# Patient Record
Sex: Female | Born: 1965 | Race: White | Hispanic: No | Smoking: Former smoker
Health system: Southern US, Community
[De-identification: ages and names within clinical notes are randomized; demographics above are authoritative.]

## PROBLEM LIST (undated history)

## (undated) DIAGNOSIS — Z9889 Other specified postprocedural states: Secondary | ICD-10-CM

## (undated) DIAGNOSIS — F419 Anxiety disorder, unspecified: Secondary | ICD-10-CM

## (undated) DIAGNOSIS — F410 Panic disorder [episodic paroxysmal anxiety] without agoraphobia: Secondary | ICD-10-CM

## (undated) DIAGNOSIS — I2510 Atherosclerotic heart disease of native coronary artery without angina pectoris: Secondary | ICD-10-CM

## (undated) DIAGNOSIS — E039 Hypothyroidism, unspecified: Secondary | ICD-10-CM

## (undated) DIAGNOSIS — R079 Chest pain, unspecified: Secondary | ICD-10-CM

## (undated) DIAGNOSIS — E785 Hyperlipidemia, unspecified: Secondary | ICD-10-CM

## (undated) DIAGNOSIS — E079 Disorder of thyroid, unspecified: Secondary | ICD-10-CM

## (undated) DIAGNOSIS — R112 Nausea with vomiting, unspecified: Secondary | ICD-10-CM

## (undated) DIAGNOSIS — M1990 Unspecified osteoarthritis, unspecified site: Secondary | ICD-10-CM

## (undated) DIAGNOSIS — K219 Gastro-esophageal reflux disease without esophagitis: Secondary | ICD-10-CM

## (undated) HISTORY — DX: Atherosclerotic heart disease of native coronary artery without angina pectoris: I25.10

## (undated) HISTORY — PX: TUBAL LIGATION: SHX77

## (undated) HISTORY — DX: Anxiety disorder, unspecified: F41.9

## (undated) HISTORY — DX: Chest pain, unspecified: R07.9

## (undated) HISTORY — DX: Panic disorder (episodic paroxysmal anxiety): F41.0

## (undated) HISTORY — PX: BREAST SURGERY: SHX581

## (undated) HISTORY — PX: ENDOMETRIAL ABLATION: SHX621

---

## 2000-10-31 ENCOUNTER — Ambulatory Visit (HOSPITAL_COMMUNITY): Admission: RE | Admit: 2000-10-31 | Discharge: 2000-10-31 | Payer: Self-pay | Admitting: Neurosurgery

## 2000-10-31 ENCOUNTER — Encounter: Payer: Self-pay | Admitting: General Surgery

## 2001-02-23 ENCOUNTER — Other Ambulatory Visit: Admission: RE | Admit: 2001-02-23 | Discharge: 2001-02-23 | Payer: Self-pay | Admitting: Obstetrics and Gynecology

## 2005-07-15 ENCOUNTER — Ambulatory Visit (HOSPITAL_COMMUNITY): Admission: RE | Admit: 2005-07-15 | Discharge: 2005-07-15 | Payer: Self-pay | Admitting: Family Medicine

## 2005-10-13 ENCOUNTER — Emergency Department (HOSPITAL_COMMUNITY): Admission: EM | Admit: 2005-10-13 | Discharge: 2005-10-13 | Payer: Self-pay | Admitting: Emergency Medicine

## 2006-06-27 ENCOUNTER — Ambulatory Visit (HOSPITAL_COMMUNITY): Admission: RE | Admit: 2006-06-27 | Discharge: 2006-06-27 | Payer: Self-pay | Admitting: Family Medicine

## 2008-01-14 ENCOUNTER — Emergency Department (HOSPITAL_COMMUNITY): Admission: EM | Admit: 2008-01-14 | Discharge: 2008-01-14 | Payer: Self-pay | Admitting: Emergency Medicine

## 2008-04-04 ENCOUNTER — Ambulatory Visit (HOSPITAL_COMMUNITY): Admission: RE | Admit: 2008-04-04 | Discharge: 2008-04-04 | Payer: Self-pay | Admitting: Family Medicine

## 2008-06-18 ENCOUNTER — Emergency Department (HOSPITAL_COMMUNITY): Admission: EM | Admit: 2008-06-18 | Discharge: 2008-06-18 | Payer: Self-pay | Admitting: Emergency Medicine

## 2008-06-18 ENCOUNTER — Encounter: Payer: Self-pay | Admitting: Orthopedic Surgery

## 2008-06-23 ENCOUNTER — Ambulatory Visit: Payer: Self-pay | Admitting: Orthopedic Surgery

## 2008-06-23 DIAGNOSIS — M79609 Pain in unspecified limb: Secondary | ICD-10-CM

## 2009-11-07 ENCOUNTER — Ambulatory Visit (HOSPITAL_COMMUNITY): Admission: RE | Admit: 2009-11-07 | Discharge: 2009-11-07 | Payer: Self-pay | Admitting: Internal Medicine

## 2009-11-13 ENCOUNTER — Encounter (HOSPITAL_COMMUNITY): Admission: RE | Admit: 2009-11-13 | Discharge: 2009-11-13 | Payer: Self-pay | Admitting: Internal Medicine

## 2009-11-13 ENCOUNTER — Emergency Department (HOSPITAL_COMMUNITY): Admission: EM | Admit: 2009-11-13 | Discharge: 2009-11-13 | Payer: Self-pay | Admitting: Emergency Medicine

## 2009-11-23 ENCOUNTER — Encounter (INDEPENDENT_AMBULATORY_CARE_PROVIDER_SITE_OTHER): Payer: Self-pay | Admitting: *Deleted

## 2009-12-07 ENCOUNTER — Emergency Department (HOSPITAL_COMMUNITY): Admission: EM | Admit: 2009-12-07 | Discharge: 2009-12-07 | Payer: Self-pay | Admitting: Emergency Medicine

## 2009-12-15 ENCOUNTER — Emergency Department (HOSPITAL_COMMUNITY): Admission: EM | Admit: 2009-12-15 | Discharge: 2009-12-15 | Payer: Self-pay | Admitting: Emergency Medicine

## 2009-12-28 ENCOUNTER — Ambulatory Visit (HOSPITAL_COMMUNITY)
Admission: RE | Admit: 2009-12-28 | Discharge: 2009-12-28 | Payer: Self-pay | Source: Home / Self Care | Admitting: Family Medicine

## 2010-02-01 ENCOUNTER — Ambulatory Visit: Payer: Self-pay | Admitting: Gastroenterology

## 2010-02-01 DIAGNOSIS — K219 Gastro-esophageal reflux disease without esophagitis: Secondary | ICD-10-CM

## 2010-02-01 DIAGNOSIS — K3189 Other diseases of stomach and duodenum: Secondary | ICD-10-CM

## 2010-02-01 DIAGNOSIS — R1013 Epigastric pain: Secondary | ICD-10-CM

## 2010-02-19 ENCOUNTER — Ambulatory Visit (HOSPITAL_COMMUNITY)
Admission: RE | Admit: 2010-02-19 | Discharge: 2010-02-19 | Payer: Self-pay | Source: Home / Self Care | Admitting: Gastroenterology

## 2010-02-19 ENCOUNTER — Ambulatory Visit: Payer: Self-pay | Admitting: Gastroenterology

## 2010-03-20 ENCOUNTER — Telehealth (INDEPENDENT_AMBULATORY_CARE_PROVIDER_SITE_OTHER): Payer: Self-pay

## 2010-03-23 ENCOUNTER — Ambulatory Visit (HOSPITAL_COMMUNITY)
Admission: RE | Admit: 2010-03-23 | Discharge: 2010-03-23 | Payer: Self-pay | Source: Home / Self Care | Attending: Obstetrics and Gynecology | Admitting: Obstetrics and Gynecology

## 2010-04-03 ENCOUNTER — Telehealth (INDEPENDENT_AMBULATORY_CARE_PROVIDER_SITE_OTHER): Payer: Self-pay

## 2010-04-29 ENCOUNTER — Encounter: Payer: Self-pay | Admitting: Family Medicine

## 2010-05-08 NOTE — Assessment & Plan Note (Signed)
Summary: DYSPEPSIA, GERD   Visit Type:  Initial Consult Referring Provider:  Phillips Odor Primary Care Provider:  Phillips Odor, M.D.  Chief Complaint:  GERD.  History of Present Illness: Sx since May 2011, mild and then got worse in AUG. Burning constantly in chest.  Seen in the ED AUG 2011 [facility] and placed on OMP two times a day. Doing better and then SEP 2011 started having burning again changed to Nexium once daily on OCT 15. Not eating right and got burning and chest pain. Eating better now: chicken , bread, boiled potatoes. Lost 19 lbs. 15 mins after she eats mild burning pain, relieved w/ TUMs since Nexium. No problems swallowing, vomiting, nausea, diarrhea, constipation, BRBPR, or melena. Uses Tylenol. No ASA or NSAIDS. No Etoh/tob. Mouth is really dry. PND bad right now. Has had upset: soft stool. Stress: worse since May-money, family, job. Worries she may have cancer.   On Zoloft for 12 years and it makes her sleepy. NECK PAIN AFTER SLEEPING ON 2 PILLOWS.  Current Medications (verified): 1)  Zoloft 50 Mg Tabs (Sertraline Hcl) .... Take 1 Tablet By Mouth Once A Day 2)  Nexium 40 Mg Cpdr (Esomeprazole Magnesium) .... Take 1 Tablet By Mouth Once A Day  Allergies (verified): No Known Drug Allergies  Past History:  Past Medical History: Anxiety Panic attacks  Past Surgical History: c section x 2 Tubal Ligation  Family History: Family History of Diabetes Family History Coronary Heart Disease female < 64 Family History of Arthritis   GERD: Mother and dad: Prilosec/Prevacid No FH of Colon Cancer or polyps Family History of Liver Cancer: uncle, ETOH Family History of Pancreatic Cancer: no Family History of Stomach Cancer: no Family History of Breast Cancer: no Family History of Ovarian Cancer: no  Social History: Patient is separated for 4-5 years. 3 children: youngest 21 yo  child care Tobacco/Alcohol Use - no  Review of Systems       AUG 2011: 173 lbs, NL HIDA & ABD U/S  SEP  2011: CT A/P W/O IVC- FIBROIDS  OCT 2011-H. PYLORI SERO NEG  ROS PER HPI OTHERWISE ALL SYSTEMS NEGATIVE.  Vital Signs:  Patient profile:   45 year old female Height:      63 inches Weight:      161 pounds BMI:     28.62 Temp:     98.1 degrees F oral Pulse rate:   80 / minute BP sitting:   120 / 80  (left arm) Cuff size:   regular  Vitals Entered By: Cloria Spring LPN (February 01, 2010 10:08 AM)  Physical Exam  General:  Well developed, well nourished, no acute distress. Head:  Normocephalic and atraumatic. Eyes:  PERRL, no icterus. Mouth:  No deformity or lesions. Neck:  Supple; no masses. Lungs:  Clear throughout to auscultation. Heart:  Regular rate and rhythm; no murmurs. Abdomen:  Soft, nontender and nondistended. Normal bowel sounds. Extremities:  No edema or deformities noted. Neurologic:  Alert and  oriented x4;  grossly normal neurologically.  Impression & Recommendations:  Problem # 1:  DYSPEPSIA (ICD-536.8) Most likley 2o to non-ulcer dyspepsia, doubt H. pylori gastritis, PUD, or gastric CA. Declined increase in Zoloft and mental health referral. EGD NOV 2011. If Bx negative and no erosive esophagitis, consider change from Zoloft to Imipramine.  Problem # 2:  GERD (ICD-530.81) Assessment: Improved Continue Nexium once daily. Follow a low fat diet. Will provide reflux HO after EGD. OPV IN 4 MOS.  Appended Document: DYSPEPSIA, GERD 4  month reminder is in the computer  Appended Document: Orders Update    Clinical Lists Changes  Orders: Added new Service order of Consultation Level IV [phone]) - Signed

## 2010-05-08 NOTE — Letter (Signed)
Summary: Unable to Reach, Consult Scheduled  [facility]  [address]   [place], [place] [address]   Phone: [phone]  Fax: [phone]    11/23/2009  Ann York [address] [place], [place]  [address] [birth year]   Dear [name],   We have been unable to reach you by phone.  Please contact our office with an updated phone number.  At the recommendation of DR [facility]  we have been asked to schedule you a consult with DR Jena Gauss OR DR FIELDS for ABDOMINAL PAIN .     Please call our office at [phone].     Thank you,    Diana Eves  [facility] R. Roetta Sessions, M.D.    Jonette Eva, M.D. Lorenza Burton, FNP-BC    Tana Coast, PA-C Phone: [phone]    Fax: [phone]

## 2010-05-10 NOTE — Progress Notes (Signed)
Summary: pt requests samples of Nexium  Phone Note Call from Patient   Caller: Patient Summary of Call: Pt requests samples of Nexium. Left 4 boxes (# 20 ) at the front for pick-up.  Initial call taken by: Cloria Spring LPN,  April 03, 2010 9:12 AM

## 2010-05-10 NOTE — Progress Notes (Signed)
Summary: pt requests samples of Nexium  Phone Note Call from Patient   Caller: Patient Summary of Call: Pt called and requested samples of Nexium. Said she has signed up for help with the Nexium program. Placing 2 boxes at the front for pick up. (that is all we have at present) Initial call taken by: Cloria Spring LPN,  March 20, 2010 8:52 AM

## 2010-05-22 ENCOUNTER — Encounter (INDEPENDENT_AMBULATORY_CARE_PROVIDER_SITE_OTHER): Payer: Self-pay | Admitting: *Deleted

## 2010-05-30 NOTE — Letter (Signed)
Summary: Recall Office Visit  [facility]  [address]   [place], [place] [address]   Phone: [phone]  Fax: [phone]      May 22, 2010   Ann York [address] [place], [place]  [address] 1965/09/13   Dear [name],   According to our records, it is time for you to schedule a follow-up office visit with [place].   At your convenience, please call [phone] to schedule an office visit. If you have any questions, concerns, or feel that this letter is in error, we would appreciate your call.   Sincerely,    Diana Eves  [facility] Ph: [phone]   Fax: [phone]

## 2010-06-19 LAB — CBC
HCT: 36.9 % (ref 36.0–46.0)
Hemoglobin: 12.5 g/dL (ref 12.0–15.0)
MCV: 85.8 fL (ref 78.0–100.0)
Platelets: 253 10*3/uL (ref 150–400)
RDW: 13.7 % (ref 11.5–15.5)

## 2010-06-19 LAB — SURGICAL PCR SCREEN
MRSA, PCR: NEGATIVE
Staphylococcus aureus: NEGATIVE

## 2010-06-21 LAB — DIFFERENTIAL
Basophils Absolute: 0 10*3/uL (ref 0.0–0.1)
Basophils Relative: 1 % (ref 0–1)
Eosinophils Relative: 1 % (ref 0–5)
Lymphocytes Relative: 17 % (ref 12–46)
Lymphs Abs: 1.4 10*3/uL (ref 0.7–4.0)
Monocytes Absolute: 0.5 10*3/uL (ref 0.1–1.0)
Monocytes Relative: 7 % (ref 3–12)

## 2010-06-21 LAB — GC/CHLAMYDIA PROBE AMP, GENITAL
Chlamydia, DNA Probe: NEGATIVE
GC Probe Amp, Genital: NEGATIVE

## 2010-06-21 LAB — PREGNANCY, URINE
Preg Test, Ur: NEGATIVE
Preg Test, Ur: NEGATIVE

## 2010-06-21 LAB — URINE MICROSCOPIC-ADD ON

## 2010-06-21 LAB — CBC
HCT: 39.8 % (ref 36.0–46.0)
Hemoglobin: 13.4 g/dL (ref 12.0–15.0)
MCH: 30 pg (ref 26.0–34.0)
Platelets: 318 10*3/uL (ref 150–400)

## 2010-06-21 LAB — WET PREP, GENITAL: Trich, Wet Prep: NONE SEEN

## 2010-06-21 LAB — URINALYSIS, ROUTINE W REFLEX MICROSCOPIC
Bilirubin Urine: NEGATIVE
Glucose, UA: NEGATIVE mg/dL
Glucose, UA: NEGATIVE mg/dL
Ketones, ur: 40 mg/dL — AB
Leukocytes, UA: NEGATIVE
Protein, ur: NEGATIVE mg/dL
Protein, ur: NEGATIVE mg/dL
Urobilinogen, UA: 0.2 mg/dL (ref 0.0–1.0)

## 2010-06-21 LAB — URINE CULTURE
Colony Count: NO GROWTH
Culture: NO GROWTH

## 2010-06-21 LAB — BASIC METABOLIC PANEL
Calcium: 9.3 mg/dL (ref 8.4–10.5)
Creatinine, Ser: 0.64 mg/dL (ref 0.4–1.2)
GFR calc Af Amer: >60 mL/min (ref >60)
Sodium: 138 mEq/L (ref 135–145)

## 2010-06-22 LAB — COMPREHENSIVE METABOLIC PANEL
AST: 22 U/L (ref 0–37)
Albumin: 4.3 g/dL (ref 3.5–5.2)
Chloride: 107 mEq/L (ref 96–112)
Creatinine, Ser: 0.67 mg/dL (ref 0.4–1.2)
GFR calc Af Amer: >60 mL/min (ref >60)
Total Bilirubin: 0.6 mg/dL (ref 0.3–1.2)
Total Protein: 7.8 g/dL (ref 6.0–8.3)

## 2010-06-22 LAB — CBC
MCH: 30 pg (ref 26.0–34.0)
Platelets: 295 10*3/uL (ref 150–400)
RBC: 4.45 MIL/uL (ref 3.87–5.11)
WBC: 9.1 10*3/uL (ref 4.0–10.5)

## 2010-06-22 LAB — DIFFERENTIAL
Basophils Absolute: 0 10*3/uL (ref 0.0–0.1)
Eosinophils Relative: 1 % (ref 0–5)
Lymphocytes Relative: 19 % (ref 12–46)
Lymphs Abs: 1.7 10*3/uL (ref 0.7–4.0)
Monocytes Absolute: 0.5 10*3/uL (ref 0.1–1.0)
Monocytes Relative: 6 % (ref 3–12)

## 2010-12-04 IMAGING — CT CT ABD-PELV W/O CM
2 of 4 series · 17 of 46 positions shown, 19 images · non-contrast
Comparison: None.

CLINICAL DATA: Lower abdominal and back pain.

CT ABDOMEN AND PELVIS WITHOUT CONTRAST
TECHNIQUE: Multidetector CT imaging of the abdomen and pelvis was
performed following the standard protocol without intravenous
contrast.

[Series 2: abd|pel w/o 5.0 b40f · axial · non-contrast · 0.66mm/px · z∈[-438,-58]mm · 14 of 84 slices shown, 16 images]
[im 4/84  soft-tissue]
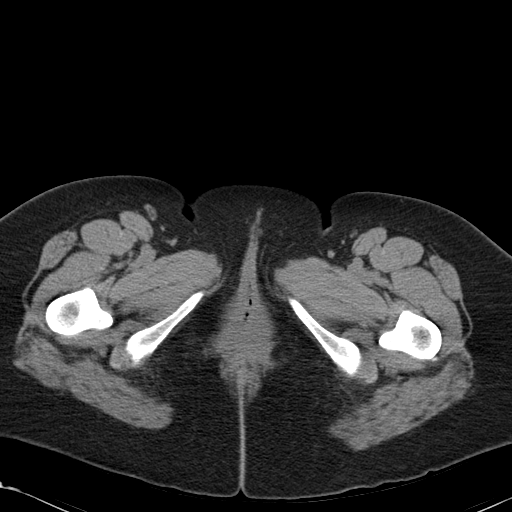
[im 4/84  bone]
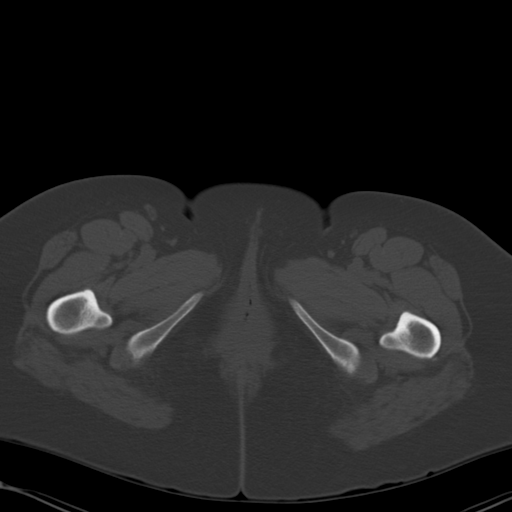
[im 10/84  soft-tissue]
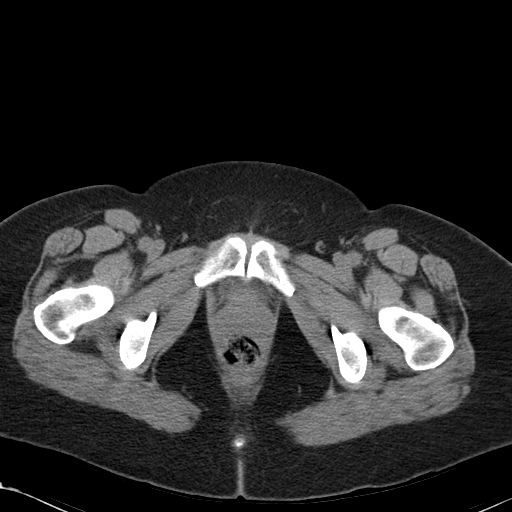
[im 17/84  soft-tissue]
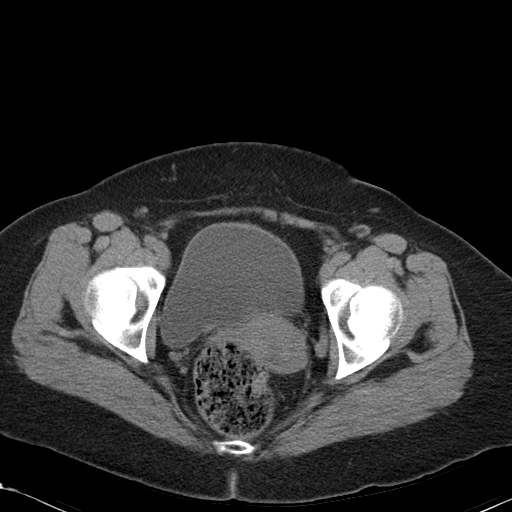
[im 24/84  soft-tissue]
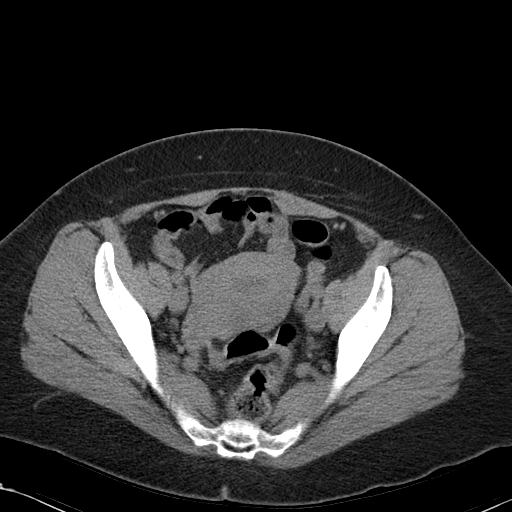
[im 27/84  soft-tissue]
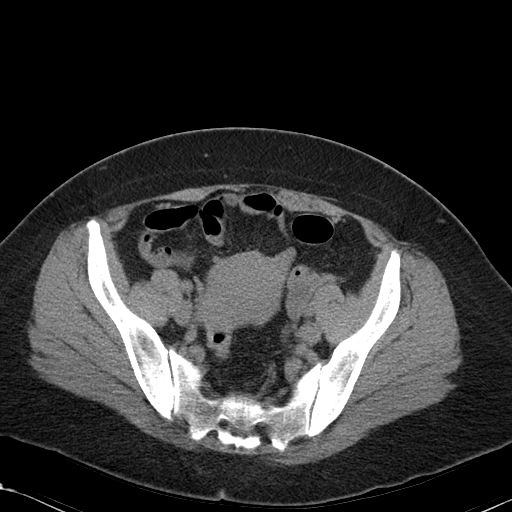
[im 34/84  soft-tissue]
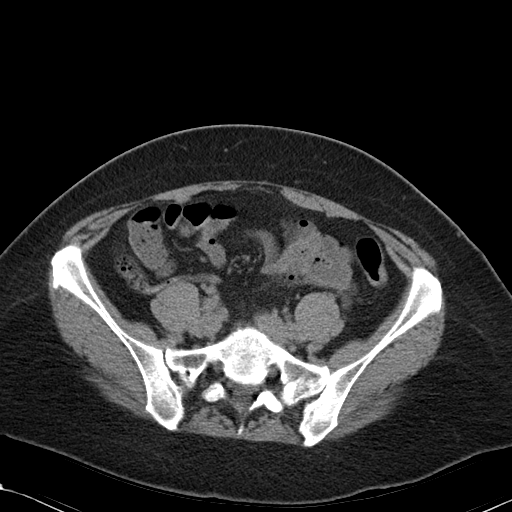
[im 40/84  soft-tissue]
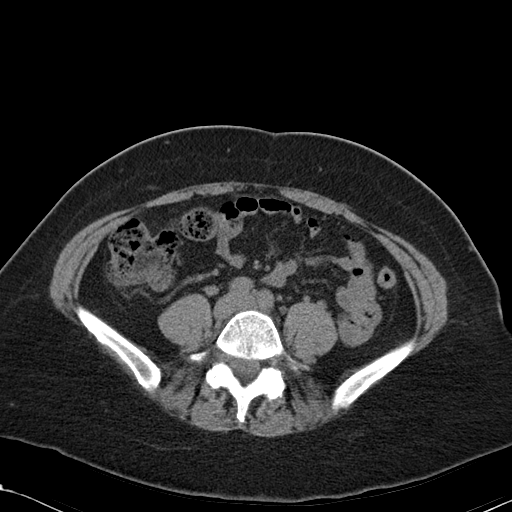
[im 44/84  soft-tissue]
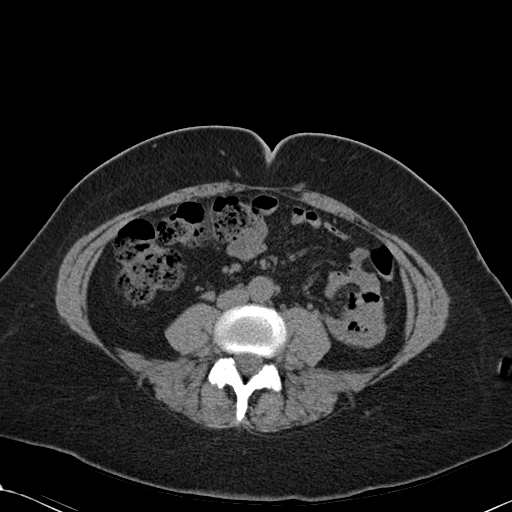
[im 50/84  soft-tissue]
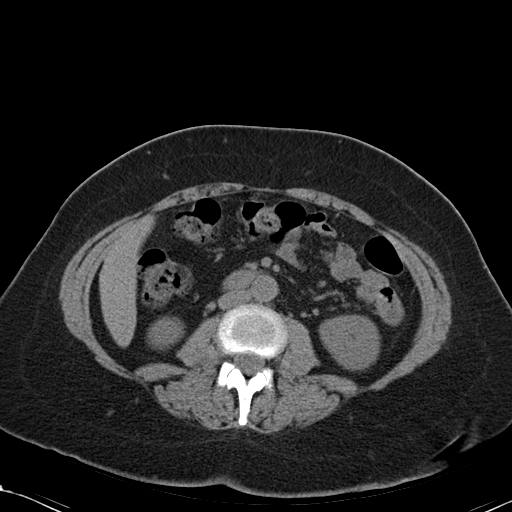
[im 50/84  bone]
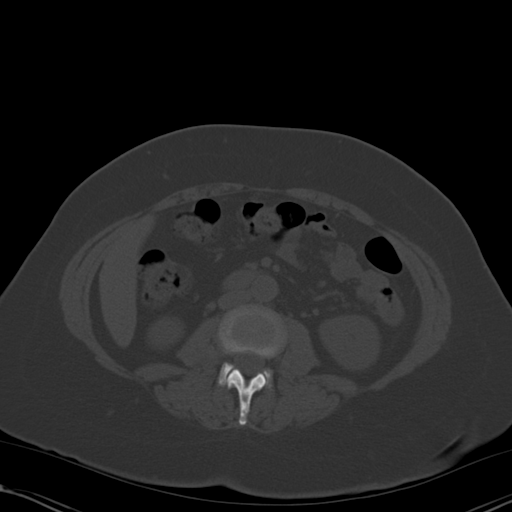
[im 57/84  soft-tissue]
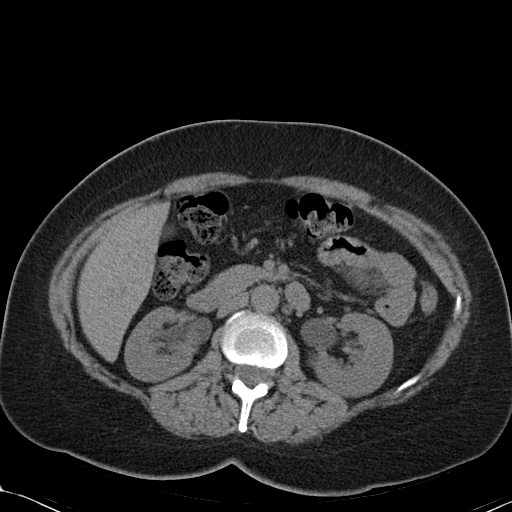
[im 64/84  soft-tissue]
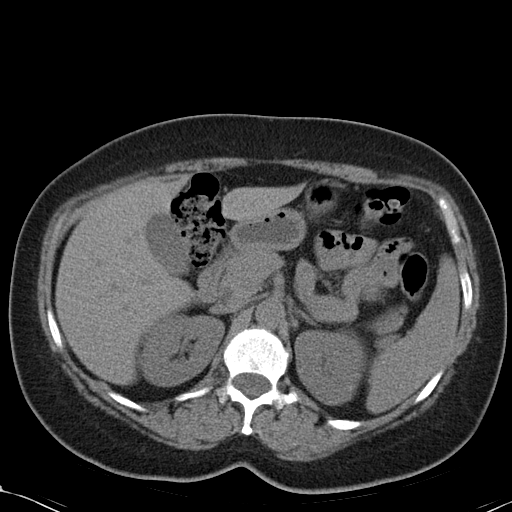
[im 67/84  soft-tissue]
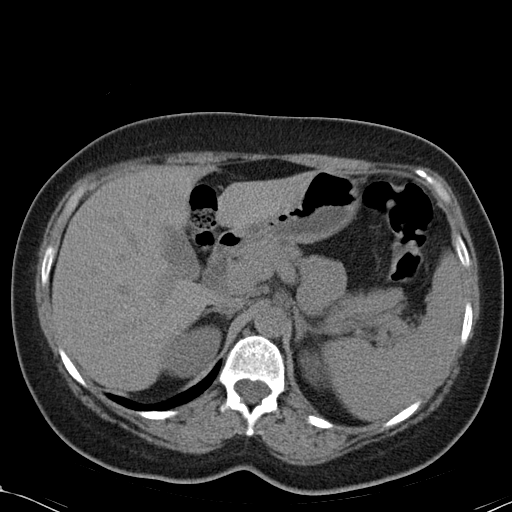
[im 74/84  soft-tissue]
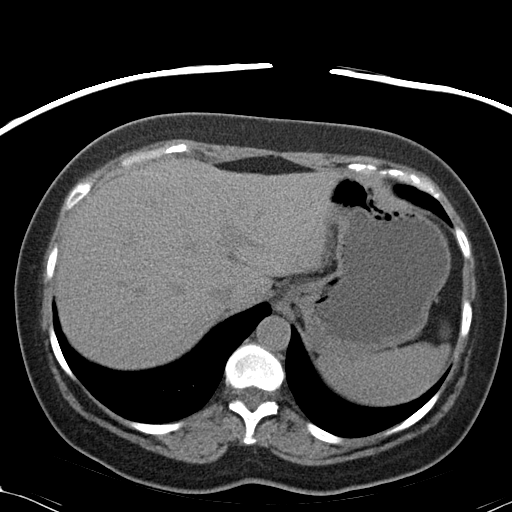
[im 80/84  soft-tissue]
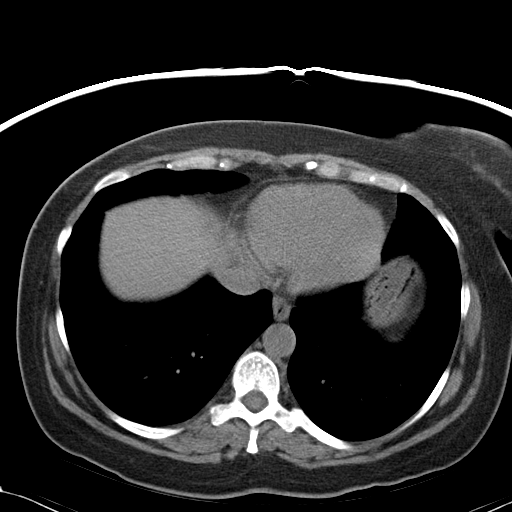

[Series 4: mpr cor (id) · coronal · 0.70mm/px · 3 of 66 slices shown]
[im 22/66  soft-tissue]
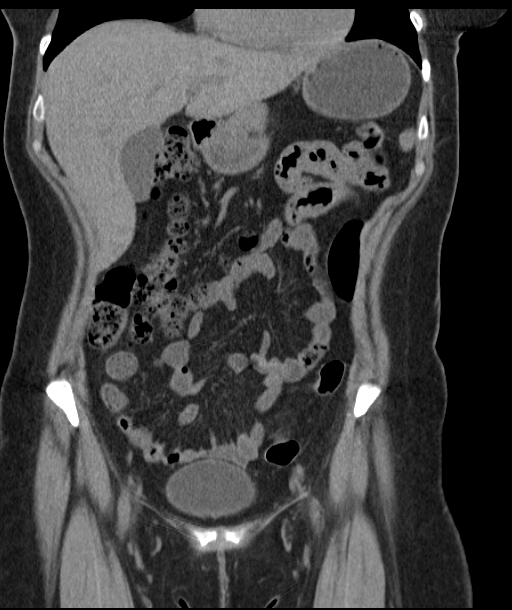
[im 29/66  soft-tissue]
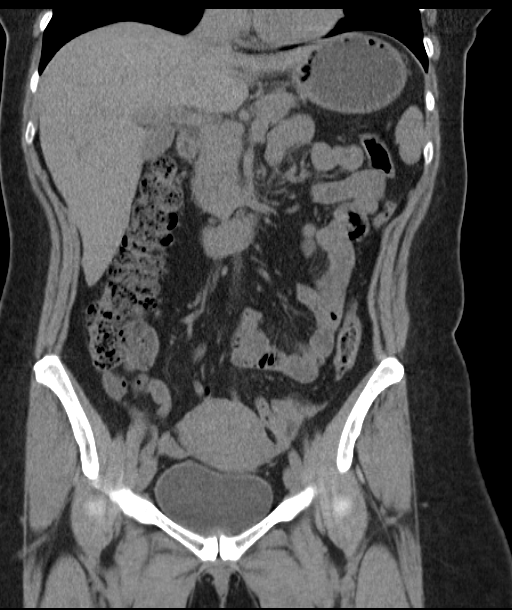
[im 37/66  soft-tissue]
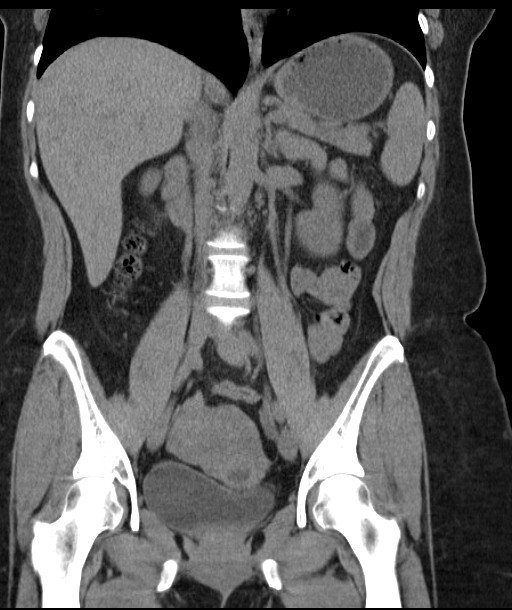

[17 of 46 positions shown; findings below may reference images not displayed]

FINDINGS: No evidence of renal calculi or hydronephrosis.  No
evidence of ureteral calculi or dilatation.

Mild lobulated contour the uterus is seen in small fibroids cannot
be excluded.  No other masses or lymphadenopathy identified within
the abdomen or pelvis.  No evidence of inflammatory process or
abnormal fluid collections.  Unopacified bowel has a normal
appearance.
IMPRESSION: 1.  No evidence of urolithiasis, hydronephrosis, or other acute
findings.
2.  Possible small uterine fibroids.  Non-emergent pelvic
ultrasound could be performed as an outpatient for further
evaluation if clinically warranted.

## 2010-12-05 ENCOUNTER — Ambulatory Visit: Payer: Self-pay | Admitting: Gastroenterology

## 2010-12-13 ENCOUNTER — Ambulatory Visit: Payer: Self-pay | Admitting: Gastroenterology

## 2010-12-13 ENCOUNTER — Telehealth: Payer: Self-pay | Admitting: Gastroenterology

## 2010-12-13 NOTE — Telephone Encounter (Signed)
noted 

## 2011-03-26 ENCOUNTER — Encounter (HOSPITAL_COMMUNITY): Payer: Self-pay | Admitting: *Deleted

## 2011-03-26 ENCOUNTER — Emergency Department (HOSPITAL_COMMUNITY): Payer: Medicaid Other

## 2011-03-26 ENCOUNTER — Emergency Department (HOSPITAL_COMMUNITY)
Admission: EM | Admit: 2011-03-26 | Discharge: 2011-03-26 | Disposition: A | Discharge status: Home / Self Care | Payer: Medicaid Other | Attending: Emergency Medicine | Admitting: Emergency Medicine

## 2011-03-26 DIAGNOSIS — J329 Chronic sinusitis, unspecified: Secondary | ICD-10-CM | POA: Insufficient documentation

## 2011-03-26 DIAGNOSIS — J40 Bronchitis, not specified as acute or chronic: Secondary | ICD-10-CM | POA: Insufficient documentation

## 2011-03-26 MED ORDER — AMOXICILLIN-POT CLAVULANATE 875-125 MG PO TABS: 1.0000 | ORAL_TABLET | Freq: Two times a day (BID) | ORAL | Status: AC

## 2011-03-26 MED ORDER — AMOXICILLIN-POT CLAVULANATE 875-125 MG PO TABS
1.0000 | ORAL_TABLET | Freq: Once | ORAL | Status: AC
  Administered 2011-03-26: 1 via ORAL
  Filled 2011-03-26: qty 1

## 2011-03-26 NOTE — ED Provider Notes (Signed)
History     CSN: [id number] Arrival date & time: 03/26/2011  7:22 PM   First MD Initiated Contact with Patient 03/26/11 1946      Chief Complaint  Patient presents with  . Cough    (Consider location/radiation/quality/duration/timing/severity/associated sxs/prior treatment) HPI Comments: Pt also having sinus pain and d/c   Patient is a 45 y.o. female presenting with cough. The history is provided by the patient. No language interpreter was used.  Cough This is a new problem. The current episode started 2 days ago. The problem occurs constantly. The problem has been gradually worsening. The cough is non-productive. The maximum temperature recorded prior to her arrival was 100 to 100.9 F. Pertinent negatives include no shortness of breath and no wheezing. She has tried nothing for the symptoms. She is not a smoker. Her past medical history is significant for bronchitis.    Past Medical History  Diagnosis Date  . Anxiety   . Panic attacks     Past Surgical History  Procedure Date  . Cesarean section     x 2  . Tubal ligation     History reviewed. No pertinent family history.  History  Substance Use Topics  . Smoking status: Former Smoker    Types: Cigarettes  . Smokeless tobacco: Not on file  . Alcohol Use: No    OB History    Grav Para Term Preterm Abortions TAB SAB Ect Mult Living                  Review of Systems  Constitutional: Positive for fever.  HENT: Positive for voice change and sinus pressure.   Respiratory: Positive for cough. Negative for shortness of breath, wheezing and stridor.     Allergies  Review of patient's allergies indicates no known allergies.  Home Medications   Current Outpatient Rx  Name Route Sig Dispense Refill  . ESOMEPRAZOLE MAGNESIUM 40 MG PO CPDR Oral Take 40 mg by mouth daily before breakfast.      . SERTRALINE HCL 50 MG PO TABS Oral Take 50 mg by mouth daily.        BP 118/81  Pulse 110  Temp(Src) 98.7 F (37.1  C) (Oral)  Resp 20  Ht 5\' 3"  (1.6 m)  Wt 136 lb (61.689 kg)  BMI 24.09 kg/m2  SpO2 100%  Physical Exam  Nursing note and vitals reviewed. Constitutional: She is oriented to person, place, and time. She appears well-developed and well-nourished. No distress.  HENT:  Head: Normocephalic and atraumatic.    Right Ear: External ear normal.  Left Ear: External ear normal.  Nose: Nose normal.  Mouth/Throat: Oropharynx is clear and moist. No oropharyngeal exudate.  Eyes: EOM are normal.  Neck: Normal range of motion.  Cardiovascular: Normal rate, regular rhythm and normal heart sounds.   Pulmonary/Chest: Effort normal and breath sounds normal.  Abdominal: Soft. She exhibits no distension. There is no tenderness.  Musculoskeletal: Normal range of motion.  Neurological: She is alert and oriented to person, place, and time.  Skin: Skin is warm and dry.  Psychiatric: She has a normal mood and affect. Judgment normal.    ED Course  Procedures (including critical care time)  Labs Reviewed - No data to display Dg Chest 2 View  03/26/2011  *RADIOLOGY REPORT*  Clinical Data:  Cough and congestion.  CHEST - 2 VIEW  Comparison: 01/14/2008  Findings: The heart size and mediastinal contours are within normal limits.  Both lungs are clear.  The  visualized skeletal structures are unremarkable.  IMPRESSION: No active disease.  Original Report Authenticated By: Reola Calkins, M.D.     No diagnosis found.    MDM          Worthy Rancher, PA 03/26/11 [phone]

## 2011-03-26 NOTE — ED Notes (Signed)
Pt c/o cough, congestion, low grade fever and headache for since Friday.

## 2011-03-27 NOTE — ED Provider Notes (Signed)
Medical screening examination/treatment/procedure(s) were performed by non-physician practitioner and as supervising physician I was immediately available for consultation/collaboration.  Nicoletta Dress. Colon Branch, MD 03/27/11 1312

## 2011-05-13 ENCOUNTER — Other Ambulatory Visit (HOSPITAL_COMMUNITY): Payer: Self-pay | Admitting: Family Medicine

## 2011-05-13 DIAGNOSIS — Z139 Encounter for screening, unspecified: Secondary | ICD-10-CM

## 2011-05-16 ENCOUNTER — Ambulatory Visit (HOSPITAL_COMMUNITY): Payer: Self-pay

## 2011-05-30 ENCOUNTER — Ambulatory Visit (HOSPITAL_COMMUNITY)
Admission: RE | Admit: 2011-05-30 | Discharge: 2011-05-30 | Disposition: A | Discharge status: Home / Self Care | Payer: Medicaid Other | Source: Ambulatory Visit | Attending: Family Medicine | Admitting: Family Medicine

## 2011-05-30 DIAGNOSIS — Z1231 Encounter for screening mammogram for malignant neoplasm of breast: Secondary | ICD-10-CM | POA: Insufficient documentation

## 2011-05-30 DIAGNOSIS — Z139 Encounter for screening, unspecified: Secondary | ICD-10-CM

## 2011-07-01 ENCOUNTER — Emergency Department (HOSPITAL_COMMUNITY)
Admission: EM | Admit: 2011-07-01 | Discharge: 2011-07-01 | Disposition: A | Discharge status: Home / Self Care | Payer: Medicaid Other | Attending: Emergency Medicine | Admitting: Emergency Medicine

## 2011-07-01 ENCOUNTER — Encounter (HOSPITAL_COMMUNITY): Payer: Self-pay | Admitting: *Deleted

## 2011-07-01 DIAGNOSIS — J0100 Acute maxillary sinusitis, unspecified: Secondary | ICD-10-CM | POA: Insufficient documentation

## 2011-07-01 DIAGNOSIS — H9202 Otalgia, left ear: Secondary | ICD-10-CM

## 2011-07-01 DIAGNOSIS — H9209 Otalgia, unspecified ear: Secondary | ICD-10-CM | POA: Insufficient documentation

## 2011-07-01 DIAGNOSIS — Z87891 Personal history of nicotine dependence: Secondary | ICD-10-CM | POA: Insufficient documentation

## 2011-07-01 MED ORDER — AMOXICILLIN 500 MG PO CAPS: 500.0000 mg | ORAL_CAPSULE | Freq: Three times a day (TID) | ORAL | Status: AC

## 2011-07-01 MED ORDER — AMOXICILLIN 250 MG PO CAPS
500.0000 mg | ORAL_CAPSULE | Freq: Once | ORAL | Status: AC
  Administered 2011-07-01: 500 mg via ORAL
  Filled 2011-07-01: qty 2

## 2011-07-01 MED ORDER — FLUTICASONE PROPIONATE 50 MCG/ACT NA SUSP: 2.0000 | Freq: Every day | NASAL | Status: AC

## 2011-07-01 NOTE — Discharge Instructions (Signed)
Otalgia The most common reason for this in children is an infection of the middle ear. Pain from the middle ear is usually caused by a build-up of fluid and pressure behind the eardrum. Pain from an earache can be sharp, dull, or burning. The pain may be temporary or constant. The middle ear is connected to the nasal passages by a short narrow tube called the Eustachian tube. The Eustachian tube allows fluid to drain out of the middle ear, and helps keep the pressure in your ear equalized. CAUSES  A cold or allergy can block the Eustachian tube with inflammation and the build-up of secretions. This is especially likely in small children, because their Eustachian tube is shorter and more horizontal. When the Eustachian tube closes, the normal flow of fluid from the middle ear is stopped. Fluid can accumulate and cause stuffiness, pain, hearing loss, and an ear infection if germs start growing in this area. SYMPTOMS  The symptoms of an ear infection may include fever, ear pain, fussiness, increased crying, and irritability. Many children will have temporary and minor hearing loss during and right after an ear infection. Permanent hearing loss is rare, but the risk increases the more infections a child has. Other causes of ear pain include retained water in the outer ear canal from swimming and bathing. Ear pain in adults is less likely to be from an ear infection. Ear pain may be referred from other locations. Referred pain may be from the joint between your jaw and the skull. It may also come from a tooth problem or problems in the neck. Other causes of ear pain include:  A foreign body in the ear.   Outer ear infection.   Sinus infections.   Impacted ear wax.   Ear injury.   Arthritis of the jaw or TMJ problems.   Middle ear infection.   Tooth infections.   Sore throat with pain to the ears.  DIAGNOSIS  Your caregiver can usually make the diagnosis by examining you. Sometimes other special  studies, including x-rays and lab work may be necessary. TREATMENT   If antibiotics were prescribed, use them as directed and finish them even if you or your child's symptoms seem to be improved.   Sometimes PE tubes are needed in children. These are little plastic tubes which are put into the eardrum during a simple surgical procedure. They allow fluid to drain easier and allow the pressure in the middle ear to equalize. This helps relieve the ear pain caused by pressure changes.  HOME CARE INSTRUCTIONS   Only take over-the-counter or prescription medicines for pain, discomfort, or fever as directed by your caregiver. DO NOT GIVE CHILDREN ASPIRIN because of the association of Reye's Syndrome in children taking aspirin.   Use a cold pack applied to the outer ear for 15 to 20 minutes, 3 to 4 times per day or as needed may reduce pain. Do not apply ice directly to the skin. You may cause frost bite.   Over-the-counter ear drops used as directed may be effective. Your caregiver may sometimes prescribe ear drops.   Resting in an upright position may help reduce pressure in the middle ear and relieve pain.   Ear pain caused by rapidly descending from high altitudes can be relieved by swallowing or chewing gum. Allowing infants to suck on a bottle during airplane travel can help.   Do not smoke in the house or near children. If you are unable to quit smoking, smoke outside.     Control allergies.  SEEK IMMEDIATE MEDICAL CARE IF:   You or your child are becoming sicker.   Pain or fever relief is not obtained with medicine.   You or your child's symptoms (pain, fever, or irritability) do not improve within 24 to 48 hours or as instructed.   Severe pain suddenly stops hurting. This may indicate a ruptured eardrum.   You or your children develop new problems such as severe headaches, stiff neck, difficulty swallowing, or swelling of the face or around the ear.  Document Released: 11/10/2003  Document Revised: 03/14/2011 Document Reviewed: 03/16/2008 [facility] Patient Information 2012 [place], [place].Sinusitis Sinuses are air pockets within the bones of your face. The growth of bacteria within a sinus leads to infection. The infection prevents the sinuses from draining. This infection is called sinusitis. SYMPTOMS  There will be different areas of pain depending on which sinuses have become infected.  The maxillary sinuses often produce pain beneath the eyes.   Frontal sinusitis may cause pain in the middle of the forehead and above the eyes.  Other problems (symptoms) include:  Toothaches.   Colored, pus-like (purulent) drainage from the nose.   Swelling, warmth, and tenderness over the sinus areas may be signs of infection.  TREATMENT  Sinusitis is most often determined by an exam.X-rays may be taken. If x-rays have been taken, make sure you obtain your results or find out how you are to obtain them. Your caregiver may give you medications (antibiotics). These are medications that will help kill the bacteria causing the infection. You may also be given a medication (decongestant) that helps to reduce sinus swelling.  HOME CARE INSTRUCTIONS   Only take over-the-counter or prescription medicines for pain, discomfort, or fever as directed by your caregiver.   Drink extra fluids. Fluids help thin the mucus so your sinuses can drain more easily.   Applying either moist heat or ice packs to the sinus areas may help relieve discomfort.   Use saline nasal sprays to help moisten your sinuses. The sprays can be found at your local drugstore.  SEEK IMMEDIATE MEDICAL CARE IF:  You have a fever.   You have increasing pain, severe headaches, or toothache.   You have nausea, vomiting, or drowsiness.   You develop unusual swelling around the face or trouble seeing.  MAKE SURE YOU:   Understand these instructions.   Will watch your condition.   Will get help right away if you  are not doing well or get worse.  Document Released: 03/25/2005 Document Revised: 03/14/2011 Document Reviewed: 10/22/2006 [facility] Patient Information 2012 [place], [place].   Take the meds as directed.  Follow up with your MD as needed.

## 2011-07-01 NOTE — ED Notes (Signed)
Pt presents to er with left earache, eye pressure. States that it started several days ago.

## 2011-07-01 NOTE — ED Provider Notes (Signed)
History     CSN: [id number]  Arrival date & time 07/01/11  1757   First MD Initiated Contact with Patient 07/01/11 1841      Chief Complaint  Patient presents with  . Otalgia    (Consider location/radiation/quality/duration/timing/severity/associated sxs/prior treatment) HPI Comments: Onset of sinus pain ~ 1 week ago.  Patient is a 46 y.o. female presenting with ear pain. The history is provided by the patient. No language interpreter was used.  Otalgia This is a new problem. The current episode started 2 days ago. There is pain in both (L worse than R) ears. The problem occurs constantly. The problem has been gradually worsening. There has been no fever. The pain is moderate. Pertinent negatives include no ear discharge and no sore throat. Her past medical history does not include chronic ear infection, hearing loss or tympanostomy tube.    Past Medical History  Diagnosis Date  . Anxiety   . Panic attacks     Past Surgical History  Procedure Date  . Cesarean section     x 2  . Tubal ligation     No family history on file.  History  Substance Use Topics  . Smoking status: Former Smoker    Types: Cigarettes  . Smokeless tobacco: Not on file  . Alcohol Use: No    OB History    Grav Para Term Preterm Abortions TAB SAB Ect Mult Living                  Review of Systems  Constitutional: Negative for fever.  HENT: Positive for ear pain and sinus pressure. Negative for sore throat and ear discharge.   All other systems reviewed and are negative.    Allergies  Review of patient's allergies indicates no known allergies.  Home Medications   Current Outpatient Rx  Name Route Sig Dispense Refill  . AMOXICILLIN 500 MG PO CAPS Oral Take 1 capsule (500 mg total) by mouth 3 (three) times daily. 30 capsule 0  . FLUTICASONE PROPIONATE 50 MCG/ACT NA SUSP Nasal Place 2 sprays into the nose daily. 16 g 0    BP 126/70  Pulse 68  Temp(Src) 97.7 F (36.5 C) (Oral)   Resp 18  SpO2 100%  Physical Exam  Nursing note and vitals reviewed. Constitutional: She is oriented to person, place, and time. She appears well-developed and well-nourished. No distress.  HENT:  Head: Normocephalic and atraumatic.    Right Ear: Hearing, tympanic membrane, external ear and ear canal normal.  Left Ear: Hearing, tympanic membrane, external ear and ear canal normal.  Mouth/Throat: No oropharyngeal exudate.  Eyes: EOM are normal.  Neck: Normal range of motion.  Cardiovascular: Normal rate, regular rhythm and normal heart sounds.   Pulmonary/Chest: Effort normal and breath sounds normal.  Abdominal: Soft. She exhibits no distension. There is no tenderness.  Musculoskeletal: Normal range of motion.  Lymphadenopathy:    She has no cervical adenopathy.  Neurological: She is alert and oriented to person, place, and time.  Skin: Skin is warm and dry.  Psychiatric: She has a normal mood and affect. Judgment normal.    ED Course  Procedures (including critical care time)  Labs Reviewed - No data to display No results found.   1. Sinusitis, acute, maxillary   2. Left ear pain       MDM  rx amoxicillin 500, 30 rx nasalcort spray F/u with PCP        Worthy Rancher, PA 07/01/11 1941  Worthy Rancher, PA 07/01/11 1946

## 2011-07-05 NOTE — ED Provider Notes (Signed)
Medical screening examination/treatment/procedure(s) were performed by non-physician practitioner and as supervising physician I was immediately available for consultation/collaboration.   Shelda Jakes, MD 07/05/11 [phone]

## 2013-07-06 ENCOUNTER — Ambulatory Visit: Payer: Medicaid Other | Admitting: Orthopedic Surgery

## 2015-04-05 NOTE — Progress Notes (Unsigned)
B/P; 102/70, Pulse: 83 taken by Ulyses Southward, RN

## 2017-01-07 ENCOUNTER — Encounter (HOSPITAL_COMMUNITY): Payer: Self-pay | Admitting: Emergency Medicine

## 2017-01-07 ENCOUNTER — Emergency Department (HOSPITAL_COMMUNITY)
Admission: EM | Admit: 2017-01-07 | Discharge: 2017-01-07 | Disposition: A | Discharge status: Home / Self Care | Payer: BLUE CROSS/BLUE SHIELD | Attending: Emergency Medicine | Admitting: Emergency Medicine

## 2017-01-07 DIAGNOSIS — M25531 Pain in right wrist: Secondary | ICD-10-CM | POA: Diagnosis not present

## 2017-01-07 DIAGNOSIS — R2231 Localized swelling, mass and lump, right upper limb: Secondary | ICD-10-CM | POA: Insufficient documentation

## 2017-01-07 HISTORY — DX: Disorder of thyroid, unspecified: E07.9

## 2017-01-07 HISTORY — DX: Hyperlipidemia, unspecified: E78.5

## 2017-01-07 MED ORDER — IBUPROFEN 600 MG PO TABS: 600.0000 mg | ORAL_TABLET | Freq: Four times a day (QID) | ORAL | 0 refills | Status: DC | PRN

## 2017-01-07 NOTE — ED Triage Notes (Signed)
Pain to right wrist notice after reaching back in car to get purse out of back seat.  Upon waking this ma notice right hand swollen.  Not able to remove rings.

## 2017-01-07 NOTE — Discharge Instructions (Signed)
Please keen hand elevated, ice it for 2-30 minutes several times daily.  Try to remove your ring to prevent loss of blood flow to finger.  Keep your hand elevated above your heart at rest.  Wear a wrist splint for support.  Return if your condition worsen or if you have other concerns.

## 2017-01-07 NOTE — ED Provider Notes (Signed)
[facility] DEPT Provider Note   CSN: [id number] Arrival date & time: 01/07/17  1050     History   Chief Complaint Chief Complaint  Patient presents with  . Hand Problem    right    HPI Ann York is a 51 y.o. female.  HPI   51 year old female with history of anxiety and panic attacks presents complaining of pain to her right wrist. Pt noticed pain to R wrist, that described as sharp, shooting, moderate in intensity with associated swelling to her R hand that started yesterday.  Pain worse with wrist movement. Pt is R hand dominant.  She recall reaching for her purse in the back seat while driving yesterday and think it may have caused her pain.  She denies any injury, no hx of RA, no fever, chills, numbness or weakness.  No specific treatment tried. Pt works at [employer] but denies repetitive movement.  No prior hx of PE/DVT, no recent surgery, prolonged bedrest, active cancer or hemoptysis.  No c/p or SOB.     Past Medical History:  Diagnosis Date  . Anxiety   . Hyperlipidemia   . Panic attacks   . Thyroid disease     Patient Active Problem List   Diagnosis Date Noted  . GERD 02/01/2010  . DYSPEPSIA 02/01/2010  . HAND PAIN 06/23/2008    Past Surgical History:  Procedure Laterality Date  . BREAST SURGERY     biopsy left breast  . CESAREAN SECTION     x 2  . TUBAL LIGATION      OB History    Gravida Para Term Preterm AB Living   3 3 3          SAB TAB Ectopic Multiple Live Births                   Home Medications    Prior to Admission medications   Medication Sig Start Date End Date Taking? Authorizing Provider  acetaminophen (TYLENOL) 500 MG tablet Take 500 mg by mouth as needed. For sinus pressure    [provider]  omeprazole (PRILOSEC) 20 MG capsule Take 20 mg by mouth 2 (two) times daily.    [provider]    Family History Family History  Problem Relation Age of Onset  . Rheum arthritis Mother   . Hypertension Mother    . Thyroid disease Mother   . Stroke Mother   . Hypertension Father   . Hypertension Sister   . Thyroid disease Sister   . Hypertension Brother     Social History Social History  Substance Use Topics  . Smoking status: Former Smoker    Types: Cigarettes  . Smokeless tobacco: Never Used  . Alcohol use No     Allergies   Patient has no known allergies.   Review of Systems Review of Systems  Constitutional: Negative for fever.  Musculoskeletal: Positive for arthralgias and joint swelling.  Skin: Negative for rash and wound.  Neurological: Negative for numbness.     Physical Exam Updated Vital Signs BP 121/67 (BP Location: Left Arm)   Pulse 69   Temp 98.2 F (36.8 C) (Oral)   Resp 16   Ht 5\' 1"  (1.549 m)   Wt 73.9 kg (163 lb)   SpO2 99%   BMI 30.80 kg/m   Physical Exam  Constitutional: She appears well-developed and well-nourished. No distress.  HENT:  Head: Atraumatic.  Eyes: Conjunctivae are normal.  Neck: Neck supple.  Musculoskeletal: She exhibits  tenderness (R wrist: tenderness to mid wrist dorsally on palpation with decrease wrist flexion/extension/supination/pronation.  no obvious swelling noted, no warmth or erythema.  R hand is mildly edematous involving the fingers with brisk cap refill, no deformity).  Neurological: She is alert.  Skin: No rash noted.  Psychiatric: She has a normal mood and affect.  Nursing note and vitals reviewed.    ED Treatments / Results  Labs (all labs ordered are listed, but only abnormal results are displayed) Labs Reviewed - No data to display  EKG  EKG Interpretation None       Radiology No results found.  Procedures Procedures (including critical care time)  Medications Ordered in ED Medications - No data to display   Initial Impression / Assessment and Plan / ED Course  I have reviewed the triage vital signs and the nursing notes.  Pertinent labs & imaging results that were available during my care  of the patient were reviewed by me and considered in my medical decision making (see chart for details).     BP 121/67 (BP Location: Left Arm)   Pulse 69   Temp 98.2 F (36.8 C) (Oral)   Resp 16   Ht 5\' 1"  (1.549 m)   Wt 73.9 kg (163 lb)   SpO2 99%   BMI 30.80 kg/m    Final Clinical Impressions(s) / ED Diagnoses   Final diagnoses:  Right wrist pain  Localized swelling on right hand    New Prescriptions New Prescriptions   IBUPROFEN (ADVIL,MOTRIN) 600 MG TABLET    Take 1 tablet (600 mg total) by mouth every 6 (six) hours as needed.   12:04 PM Pain and swelling to R wrist/hand likely musculoskeletal pain.  Doubt DVT, septic joint, cellulitis or other concering feature.  Doubt acute fx/dislocation.  Recommend RICE.  Return precaution given.     Domenic Moras, PA-C 01/07/17 1205    Nat Christen, MD 01/08/17 1322

## 2017-01-21 ENCOUNTER — Emergency Department (HOSPITAL_COMMUNITY)
Admission: EM | Admit: 2017-01-21 | Discharge: 2017-01-21 | Disposition: A | Discharge status: Home Health | Payer: BLUE CROSS/BLUE SHIELD | Attending: Emergency Medicine | Admitting: Emergency Medicine

## 2017-01-21 ENCOUNTER — Encounter (HOSPITAL_COMMUNITY): Payer: Self-pay | Admitting: *Deleted

## 2017-01-21 DIAGNOSIS — Z87891 Personal history of nicotine dependence: Secondary | ICD-10-CM | POA: Insufficient documentation

## 2017-01-21 DIAGNOSIS — Z79899 Other long term (current) drug therapy: Secondary | ICD-10-CM | POA: Diagnosis not present

## 2017-01-21 DIAGNOSIS — L03031 Cellulitis of right toe: Secondary | ICD-10-CM | POA: Diagnosis not present

## 2017-01-21 DIAGNOSIS — R2241 Localized swelling, mass and lump, right lower limb: Secondary | ICD-10-CM | POA: Diagnosis present

## 2017-01-21 MED ORDER — SULFAMETHOXAZOLE-TRIMETHOPRIM 800-160 MG PO TABS: 1.0000 | ORAL_TABLET | Freq: Two times a day (BID) | ORAL | 0 refills | Status: AC

## 2017-01-21 NOTE — ED Triage Notes (Signed)
Pt c/o possible spider bite in between 4th and 5th toe on right foot x 2 days ago. Pt has redness to right foot.

## 2017-01-21 NOTE — Discharge Instructions (Signed)
Use Lamiseal or Lotrim cream to area between toes.

## 2017-01-21 NOTE — ED Provider Notes (Signed)
[facility] EMERGENCY DEPARTMENT Provider Note   CSN: [id number] Arrival date & time: 01/21/17  1357     History   Chief Complaint Chief Complaint  Patient presents with  . Insect Bite    HPI Ann York is a 51 y.o. female.  The history is provided by the patient. No language interpreter was used.  Foot Injury   The incident occurred 2 days ago. The injury mechanism is unknown. The pain is present in the right foot. The pain is mild. The pain has been constant since onset. She reports no foreign bodies present. Nothing aggravates the symptoms. She has tried nothing for the symptoms.   Pt thinks something bit her between her 4th and 5th toes.  Pt complains of a white areas and redness on her foot. Pt reports area is itchy Past Medical History:  Diagnosis Date  . Anxiety   . Hyperlipidemia   . Panic attacks   . Thyroid disease     Patient Active Problem List   Diagnosis Date Noted  . GERD 02/01/2010  . DYSPEPSIA 02/01/2010  . HAND PAIN 06/23/2008    Past Surgical History:  Procedure Laterality Date  . BREAST SURGERY     biopsy left breast  . CESAREAN SECTION     x 2  . ENDOMETRIAL ABLATION    . TUBAL LIGATION      OB History    Gravida Para Term Preterm AB Living   3 3 3          SAB TAB Ectopic Multiple Live Births                   Home Medications    Prior to Admission medications   Medication Sig Start Date End Date Taking? Authorizing Provider  acetaminophen (TYLENOL) 500 MG tablet Take 500 mg by mouth as needed. For sinus pressure    [provider]  ibuprofen (ADVIL,MOTRIN) 600 MG tablet Take 1 tablet (600 mg total) by mouth every 6 (six) hours as needed. 01/07/17   Domenic Moras, PA-C  omeprazole (PRILOSEC) 20 MG capsule Take 20 mg by mouth 2 (two) times daily.    [provider]  sulfamethoxazole-trimethoprim (BACTRIM DS,SEPTRA DS) 800-160 MG tablet Take 1 tablet by mouth 2 (two) times daily. 01/21/17 01/28/17  Fransico Meadow,  PA-C    Family History Family History  Problem Relation Age of Onset  . Rheum arthritis Mother   . Hypertension Mother   . Thyroid disease Mother   . Stroke Mother   . Hypertension Father   . Hypertension Sister   . Thyroid disease Sister   . Hypertension Brother     Social History Social History  Substance Use Topics  . Smoking status: Former Smoker    Types: Cigarettes  . Smokeless tobacco: Never Used  . Alcohol use No     Allergies   Patient has no known allergies.   Review of Systems Review of Systems  All other systems reviewed and are negative.    Physical Exam Updated Vital Signs BP 110/66   Pulse 67   Temp 98 F (36.7 C) (Oral)   Resp 15   Ht 5\' 1"  (1.549 m)   Wt 73.9 kg (163 lb)   SpO2 99%   BMI 30.80 kg/m   Physical Exam  Constitutional: She appears well-developed and well-nourished.  HENT:  Head: Normocephalic.  Musculoskeletal: She exhibits tenderness.  White area between 4th and 5th, erythema dorsal aspect of foot,  Neurological: She is alert.  Skin: Skin is warm.  Psychiatric: She has a normal mood and affect.  Nursing note and vitals reviewed.    ED Treatments / Results  Labs (all labs ordered are listed, but only abnormal results are displayed) Labs Reviewed - No data to display  EKG  EKG Interpretation None       Radiology No results found.  Procedures Procedures (including critical care time)  Medications Ordered in ED Medications - No data to display   Initial Impression / Assessment and Plan / ED Course  I have reviewed the triage vital signs and the nursing notes.  Pertinent labs & imaging results that were available during my care of the patient were reviewed by me and considered in my medical decision making (see chart for details).     Pt may have fungal infection with secondary bacterial infection.  I advised lotrimin or lamiseal,  Rx for bactrim DS.  Final Clinical Impressions(s) / ED Diagnoses    Final diagnoses:  Cellulitis of toe of right foot    New Prescriptions Discharge Medication List as of 01/21/2017  3:00 PM    START taking these medications   Details  sulfamethoxazole-trimethoprim (BACTRIM DS,SEPTRA DS) 800-160 MG tablet Take 1 tablet by mouth 2 (two) times daily., Starting Tue 01/21/2017, Until Tue 01/28/2017, Print      An After Visit Summary was printed and given to the patient.    Fransico Meadow, PA-C 01/21/17 1623    Merrily Pew, MD 01/21/17 [phone]

## 2017-01-22 ENCOUNTER — Emergency Department (HOSPITAL_COMMUNITY)
Admission: EM | Admit: 2017-01-22 | Discharge: 2017-01-22 | Disposition: A | Discharge status: Home / Self Care | Payer: BLUE CROSS/BLUE SHIELD | Attending: Emergency Medicine | Admitting: Emergency Medicine

## 2017-01-22 ENCOUNTER — Encounter (HOSPITAL_COMMUNITY): Payer: Self-pay | Admitting: Emergency Medicine

## 2017-01-22 DIAGNOSIS — L03115 Cellulitis of right lower limb: Secondary | ICD-10-CM | POA: Diagnosis not present

## 2017-01-22 DIAGNOSIS — Z79899 Other long term (current) drug therapy: Secondary | ICD-10-CM | POA: Insufficient documentation

## 2017-01-22 DIAGNOSIS — T7804XA Anaphylactic reaction due to fruits and vegetables, initial encounter: Secondary | ICD-10-CM | POA: Diagnosis not present

## 2017-01-22 DIAGNOSIS — R21 Rash and other nonspecific skin eruption: Secondary | ICD-10-CM | POA: Insufficient documentation

## 2017-01-22 DIAGNOSIS — Z87891 Personal history of nicotine dependence: Secondary | ICD-10-CM | POA: Diagnosis not present

## 2017-01-22 DIAGNOSIS — T7840XA Allergy, unspecified, initial encounter: Secondary | ICD-10-CM

## 2017-01-22 MED ORDER — CEPHALEXIN 500 MG PO CAPS: 500.0000 mg | ORAL_CAPSULE | Freq: Four times a day (QID) | ORAL | 0 refills | Status: DC

## 2017-01-22 MED ORDER — METHYLPREDNISOLONE SODIUM SUCC 125 MG IJ SOLR
125.0000 mg | Freq: Once | INTRAMUSCULAR | Status: AC
  Administered 2017-01-22: 125 mg via INTRAVENOUS
  Filled 2017-01-22: qty 2

## 2017-01-22 MED ORDER — PREDNISONE 10 MG PO TABS: 40.0000 mg | ORAL_TABLET | Freq: Every day | ORAL | 0 refills | Status: DC

## 2017-01-22 MED ORDER — SODIUM CHLORIDE 0.9 % IV BOLUS (SEPSIS)
500.0000 mL | Freq: Once | INTRAVENOUS | Status: AC
  Administered 2017-01-22: 500 mL via INTRAVENOUS

## 2017-01-22 MED ORDER — EPINEPHRINE 0.3 MG/0.3ML IJ SOAJ
0.3000 mg | Freq: Once | INTRAMUSCULAR | Status: AC
  Administered 2017-01-22: 0.3 mg via INTRAMUSCULAR
  Filled 2017-01-22: qty 0.3

## 2017-01-22 NOTE — ED Notes (Signed)
Pt states she feels alot better. Color wnl.

## 2017-01-22 NOTE — Discharge Instructions (Addendum)
Discontinue all sulfa medications and do not take sulfa medications in the future.  Return if you have any difficulty breathing, swallowing, or become light headed.  Take all antibiotics as prescribed.  Keep foot elevated.  Return if increasing redness or fever.

## 2017-01-22 NOTE — ED Triage Notes (Signed)
Pt seen here yesterday for bite between pinky and fourth toe to right foot. Rash to groin and arms per pt and given septra for abx. Took first dose at 1215 today and began to feel tongue burning, feet burning and redness to arms. Pt a/o. Mildly anxiousl. Slightly pale. Redness noted to back and arms

## 2017-01-22 NOTE — ED Provider Notes (Signed)
[facility] EMERGENCY DEPARTMENT Provider Note   CSN: [id number] Arrival date & time: 01/22/17  1252     History   Chief Complaint Chief Complaint  Patient presents with  . Allergic Reaction    HPI Lateasha Breuer Gritz is a 51 y.o. female.  HPI  51 year old female seen yesterday for possible cellulitis of her foot. She has not had any change in the right foot redness. She picked up her Bactrim prescription today and took her first dose. She immediately began having some rash, flushing, tingling, and throat and tongue discomfort. She was still in the pharmacy and received Benadryl pharmacist. She was transported here via EMS. She denies any difficulty breathing, swallowing, or GI symptoms. She has had no previous history of anaphylaxis.   Past Medical History:  Diagnosis Date  . Anxiety   . Hyperlipidemia   . Panic attacks   . Thyroid disease     Patient Active Problem List   Diagnosis Date Noted  . GERD 02/01/2010  . DYSPEPSIA 02/01/2010  . HAND PAIN 06/23/2008    Past Surgical History:  Procedure Laterality Date  . BREAST SURGERY     biopsy left breast  . CESAREAN SECTION     x 2  . ENDOMETRIAL ABLATION    . TUBAL LIGATION      OB History    Gravida Para Term Preterm AB Living   3 3 3          SAB TAB Ectopic Multiple Live Births                   Home Medications    Prior to Admission medications   Medication Sig Start Date End Date Taking? Authorizing Provider  acetaminophen (TYLENOL) 500 MG tablet Take 500 mg by mouth as needed. For sinus pressure    [provider]  ibuprofen (ADVIL,MOTRIN) 600 MG tablet Take 1 tablet (600 mg total) by mouth every 6 (six) hours as needed. 01/07/17   Domenic Moras, PA-C  omeprazole (PRILOSEC) 20 MG capsule Take 20 mg by mouth 2 (two) times daily.    [provider]  sulfamethoxazole-trimethoprim (BACTRIM DS,SEPTRA DS) 800-160 MG tablet Take 1 tablet by mouth 2 (two) times daily. 01/21/17 01/28/17  Fransico Meadow, PA-C    Family History Family History  Problem Relation Age of Onset  . Rheum arthritis Mother   . Hypertension Mother   . Thyroid disease Mother   . Stroke Mother   . Hypertension Father   . Hypertension Sister   . Thyroid disease Sister   . Hypertension Brother     Social History Social History  Substance Use Topics  . Smoking status: Former Smoker    Types: Cigarettes  . Smokeless tobacco: Never Used  . Alcohol use No     Allergies   Patient has no known allergies.   Review of Systems Review of Systems  All other systems reviewed and are negative.    Physical Exam Updated Vital Signs BP 127/83   Pulse 87   Resp 18   SpO2 100%   Physical Exam  Constitutional: She is oriented to person, place, and time. She appears well-developed and well-nourished.  HENT:  Head: Normocephalic and atraumatic.  Right Ear: External ear normal.  Left Ear: External ear normal.  Mouth/Throat: Oropharynx is clear and moist.  Eyes: Pupils are equal, round, and reactive to light.  Neck: Normal range of motion.  Cardiovascular: Normal rate and regular rhythm.   Pulmonary/Chest: Effort  normal.  Abdominal: Soft. Bowel sounds are normal.  Musculoskeletal: Normal range of motion.  Right foot with well circumscribed erythematous area with some skin breakdown in between the fourth and fifth toe  Neurological: She is alert and oriented to person, place, and time.  Skin: Capillary refill takes less than 2 seconds. Rash noted. There is erythema.  Nursing note and vitals reviewed.    ED Treatments / Results  Labs (all labs ordered are listed, but only abnormal results are displayed) Labs Reviewed - No data to display  EKG  EKG Interpretation None       Radiology No results found.  Procedures Procedures (including critical care time)  Medications Ordered in ED Medications  EPINEPHrine (EPI-PEN) injection 0.3 mg (not administered)  sodium chloride 0.9 % bolus  500 mL (not administered)  methylPREDNISolone sodium succinate (SOLU-MEDROL) 125 mg/2 mL injection 125 mg (not administered)     Initial Impression / Assessment and Plan / ED Course  I have reviewed the triage vital signs and the nursing notes.  Pertinent labs & imaging results that were available during my care of the patient were reviewed by me and considered in my medical decision making (see chart for details).     Patient with rash after taking Bactrim. She is given epinephrine and Solu-Medrol. She received Benadryl prehospital. She has been observed for 3 hours here. Her rash has resolved. She has had no respiratoryproblems. She has remained hemodynamically stable. She is advised regarding return precautions. Her Bactrim was changed to Keflex for her cellulitis.  Final Clinical Impressions(s) / ED Diagnoses   Final diagnoses:  Allergic reaction, initial encounter    New Prescriptions New Prescriptions   No medications on file     Pattricia Boss, MD 01/22/17 1558

## 2017-10-31 ENCOUNTER — Encounter (HOSPITAL_COMMUNITY): Payer: Self-pay | Admitting: Emergency Medicine

## 2017-10-31 ENCOUNTER — Emergency Department (HOSPITAL_COMMUNITY)
Admission: EM | Admit: 2017-10-31 | Discharge: 2017-10-31 | Disposition: A | Discharge status: Home / Self Care | Payer: BLUE CROSS/BLUE SHIELD | Attending: Emergency Medicine | Admitting: Emergency Medicine

## 2017-10-31 ENCOUNTER — Other Ambulatory Visit: Payer: Self-pay

## 2017-10-31 ENCOUNTER — Emergency Department (HOSPITAL_COMMUNITY): Payer: BLUE CROSS/BLUE SHIELD

## 2017-10-31 DIAGNOSIS — Z79899 Other long term (current) drug therapy: Secondary | ICD-10-CM | POA: Insufficient documentation

## 2017-10-31 DIAGNOSIS — R1030 Lower abdominal pain, unspecified: Secondary | ICD-10-CM | POA: Diagnosis present

## 2017-10-31 DIAGNOSIS — R102 Pelvic and perineal pain: Secondary | ICD-10-CM

## 2017-10-31 DIAGNOSIS — F419 Anxiety disorder, unspecified: Secondary | ICD-10-CM | POA: Diagnosis not present

## 2017-10-31 DIAGNOSIS — Z87891 Personal history of nicotine dependence: Secondary | ICD-10-CM | POA: Insufficient documentation

## 2017-10-31 LAB — I-STAT CHEM 8, ED
BUN: 9 mg/dL (ref 6–20)
Calcium, Ion: 1.16 mmol/L (ref 1.15–1.40)
Chloride: 100 mmol/L (ref 98–111)
Creatinine, Ser: 0.6 mg/dL (ref 0.44–1.00)
GLUCOSE: 107 mg/dL — AB (ref 70–99)
HEMATOCRIT: 41 % (ref 36.0–46.0)
HEMOGLOBIN: 13.9 g/dL (ref 12.0–15.0)
Potassium: 3.7 mmol/L (ref 3.5–5.1)
Sodium: 139 mmol/L (ref 135–145)
TCO2: 28 mmol/L (ref 22–32)

## 2017-10-31 LAB — URINALYSIS, ROUTINE W REFLEX MICROSCOPIC
Bilirubin Urine: NEGATIVE
Glucose, UA: NEGATIVE mg/dL
Hgb urine dipstick: NEGATIVE
KETONES UR: NEGATIVE mg/dL
LEUKOCYTES UA: NEGATIVE
NITRITE: NEGATIVE
PROTEIN: NEGATIVE mg/dL
Specific Gravity, Urine: 1.011 (ref 1.005–1.030)
pH: 5 (ref 5.0–8.0)

## 2017-10-31 MED ORDER — PHENAZOPYRIDINE HCL 200 MG PO TABS: 200.0000 mg | ORAL_TABLET | Freq: Three times a day (TID) | ORAL | 0 refills | Status: DC | PRN

## 2017-10-31 MED ORDER — PHENAZOPYRIDINE HCL 100 MG PO TABS: 100.0000 mg | ORAL_TABLET | Freq: Once | ORAL | Status: DC

## 2017-10-31 MED ORDER — CEPHALEXIN 500 MG PO CAPS: 500.0000 mg | ORAL_CAPSULE | Freq: Four times a day (QID) | ORAL | 0 refills | Status: DC

## 2017-10-31 MED ORDER — PHENAZOPYRIDINE HCL 100 MG PO TABS
100.0000 mg | ORAL_TABLET | Freq: Once | ORAL | Status: AC
  Administered 2017-10-31: 100 mg via ORAL

## 2017-10-31 NOTE — Discharge Instructions (Signed)
There is an abnormal area in your liver seen on CT scan done here today.  The radiologist is recommending get an MRI to specify what it is.  It is unclear at this time whether this is benign or malignant so it is imperative that he follow-up with your primary doctor to get this test done.

## 2017-10-31 NOTE — ED Notes (Signed)
Bladder scan volume of 120 ml.

## 2017-10-31 NOTE — ED Triage Notes (Signed)
Patient complaining of lower abdominal pain radiating into her back starting this morning. States she is taking cipro given by PCP for UTI x 2 days.

## 2017-10-31 NOTE — ED Notes (Signed)
Tenderness and pressure in lower abdomen at the bladder region. Frequent urination.

## 2017-11-01 NOTE — ED Provider Notes (Signed)
[facility] EMERGENCY DEPARTMENT Provider Note   CSN: [id number] Arrival date & time: 10/31/17  1738     History   Chief Complaint Chief Complaint  Patient presents with  . Flank Pain    HPI Ann York is a 52 y.o. female.   Flank Pain  This is a new problem. The current episode started 3 to 5 hours ago. The problem occurs constantly. Associated symptoms include abdominal pain (suprapubic). Pertinent negatives include no chest pain. Nothing aggravates the symptoms. Nothing relieves the symptoms. She has tried nothing for the symptoms. The treatment provided no relief.    Past Medical History:  Diagnosis Date  . Anxiety   . Hyperlipidemia   . Panic attacks   . Thyroid disease     Patient Active Problem List   Diagnosis Date Noted  . GERD 02/01/2010  . DYSPEPSIA 02/01/2010  . HAND PAIN 06/23/2008    Past Surgical History:  Procedure Laterality Date  . BREAST SURGERY     biopsy left breast  . CESAREAN SECTION     x 2  . ENDOMETRIAL ABLATION    . TUBAL LIGATION       OB History    Gravida  3   Para  3   Term  3   Preterm      AB      Living        SAB      TAB      Ectopic      Multiple      Live Births               Home Medications    Prior to Admission medications   Medication Sig Start Date End Date Taking? Authorizing Provider  ciprofloxacin (CIPRO) 500 MG tablet Take 500 mg by mouth 2 (two) times daily. 7 day course starting on 10/29/2017 10/29/17  Yes [provider]  ibuprofen (ADVIL,MOTRIN) 200 MG tablet Take 800 mg by mouth every 6 (six) hours as needed for moderate pain.   Yes [provider]  levothyroxine (SYNTHROID, LEVOTHROID) 25 MCG tablet Take 1 tablet by mouth daily. 12/30/16  Yes [provider]  cephALEXin (KEFLEX) 500 MG capsule Take 1 capsule (500 mg total) by mouth 4 (four) times daily. 10/31/17   Terriana Barreras, Corene Cornea, MD  phenazopyridine (PYRIDIUM) 200 MG tablet Take 1 tablet (200 mg total) by  mouth 3 (three) times daily as needed for pain. 10/31/17   Cabot Cromartie, Corene Cornea, MD  esomeprazole (NEXIUM) 40 MG capsule Take 40 mg by mouth daily before breakfast.    07/01/11  [provider]  sertraline (ZOLOFT) 50 MG tablet Take 50 mg by mouth daily.    07/01/11  [provider]    Family History Family History  Problem Relation Age of Onset  . Rheum arthritis Mother   . Hypertension Mother   . Thyroid disease Mother   . Stroke Mother   . Hypertension Father   . Hypertension Sister   . Thyroid disease Sister   . Hypertension Brother     Social History Social History   Tobacco Use  . Smoking status: Former Smoker    Types: Cigarettes  . Smokeless tobacco: Never Used  Substance Use Topics  . Alcohol use: No  . Drug use: No     Allergies   Septra [sulfamethoxazole-trimethoprim]   Review of Systems Review of Systems  Cardiovascular: Negative for chest pain.  Gastrointestinal: Positive for abdominal pain (suprapubic).  Genitourinary: Positive  for flank pain.  All other systems reviewed and are negative.    Physical Exam Updated Vital Signs BP 125/64   Pulse 72   Temp 98.2 F (36.8 C) (Temporal)   Resp 16   SpO2 97%   Physical Exam  Constitutional: She is oriented to person, place, and time. She appears well-developed and well-nourished.  HENT:  Head: Normocephalic and atraumatic.  Eyes: Conjunctivae and EOM are normal.  Neck: Normal range of motion.  Cardiovascular: Normal rate and regular rhythm.  Pulmonary/Chest: Effort normal. No stridor. No respiratory distress.  Abdominal: Soft. She exhibits no distension. There is tenderness (mild suprapubic).  Neurological: She is alert and oriented to person, place, and time. No cranial nerve deficit.  Skin: Skin is warm and dry.  Nursing note and vitals reviewed.    ED Treatments / Results  Labs (all labs ordered are listed, but only abnormal results are displayed) Labs Reviewed  I-STAT CHEM 8,  ED - Abnormal; Notable for the following components:      Result Value   Glucose, Bld 107 (*)    All other components within normal limits  URINE CULTURE  URINALYSIS, ROUTINE W REFLEX MICROSCOPIC    EKG None  Radiology Ct Renal Stone Study  Result Date: 10/31/2017 CLINICAL DATA:  Lower abdominal pain radiating to back beginning this morning. On Cipro for urinary tract infection. EXAM: CT ABDOMEN AND PELVIS WITHOUT CONTRAST TECHNIQUE: Multidetector CT imaging of the abdomen and pelvis was performed following the standard protocol without IV contrast. COMPARISON:  CT abdomen and pelvis December 15, 2009. FINDINGS: LOWER CHEST: Lung bases are clear. The visualized heart size is normal. Trace pericardial thickening. HEPATOBILIARY: 11 mm hypodensity dome of the liver new from prior CT. PANCREAS: Normal. SPLEEN: Normal. ADRENALS/URINARY TRACT: Kidneys are orthotopic, demonstrating normal size and morphology. No nephrolithiasis, hydronephrosis; limited assessment for renal masses by nonenhanced CT. The unopacified ureters are normal in course and caliber. Urinary bladder is partially distended and unremarkable. Normal adrenal glands. STOMACH/BOWEL: The stomach, small and large bowel are normal in course and caliber without inflammatory changes, sensitivity decreased by lack of enteric contrast. Normal appendix. VASCULAR/LYMPHATIC: Aortoiliac vessels are normal in course and caliber. No lymphadenopathy by CT size criteria. 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 OTHER: No intraperitoneal free fluid or free air. MUSCULOSKELETAL: Non-acute. Moderate lower lumbar facet arthropathy. IMPRESSION: 1. No nephrolithiasis, hydronephrosis or acute intra-abdominal/pelvic process. 2. **An incidental finding of potential clinical significance has been found. New 11 mm hypodensity liver. Though benign appearance, as this is new, non emergent MRI of the liver is warranted. This recommendation  follows ACR consensus guidelines: Management of Incidental Liver Lesions on CT: A White Paper of the ACR Incidental Findings Committee. J Am Coll Radiol 2017; 34:7425-9563.** Electronically Signed   By: Elon Alas M.D.   On: 10/31/2017 22:59    Procedures Procedures (including critical care time)  Medications Ordered in ED Medications  phenazopyridine (PYRIDIUM) tablet 100 mg (100 mg Oral Given 10/31/17 2319)     Initial Impression / Assessment and Plan / ED Course  I have reviewed the triage vital signs and the nursing notes.  Pertinent labs & imaging results that were available during my care of the patient were reviewed by me and considered in my medical decision making (see chart for details).     Patient thinks that antibiotics are not working however urine here looks appropriate.  Likely bladder spasms the patient is insistent on prescription for new antibiotics.  She agreed to try Pyridium for a day or 2 to see if this helps her symptoms before changing to new antibiotics.  Final Clinical Impressions(s) / ED Diagnoses   Final diagnoses:  Suprapubic pain    ED Discharge Orders        Ordered    cephALEXin (KEFLEX) 500 MG capsule  4 times daily,   Status:  Discontinued     10/31/17 2326    phenazopyridine (PYRIDIUM) 200 MG tablet  3 times daily PRN     10/31/17 2326    cephALEXin (KEFLEX) 500 MG capsule  4 times daily     10/31/17 2327       Kamie Korber, Corene Cornea, MD 11/01/17 0020

## 2017-11-02 LAB — URINE CULTURE

## 2017-11-13 ENCOUNTER — Encounter: Payer: Self-pay | Admitting: Obstetrics and Gynecology

## 2017-11-13 ENCOUNTER — Ambulatory Visit (INDEPENDENT_AMBULATORY_CARE_PROVIDER_SITE_OTHER): Payer: BLUE CROSS/BLUE SHIELD | Admitting: Obstetrics and Gynecology

## 2017-11-13 VITALS — BP 114/71 | HR 71 | Ht 61.0 in | Wt 160.0 lb

## 2017-11-13 DIAGNOSIS — R3989 Other symptoms and signs involving the genitourinary system: Secondary | ICD-10-CM

## 2017-11-13 DIAGNOSIS — R39198 Other difficulties with micturition: Secondary | ICD-10-CM

## 2017-11-13 DIAGNOSIS — K5900 Constipation, unspecified: Secondary | ICD-10-CM | POA: Diagnosis not present

## 2017-11-13 NOTE — Progress Notes (Addendum)
Patient ID: Ann York, female   DOB: [birth year], 52 y.o.   MRN: [id number]    [facility] Visit  @DATE @            Patient name: Ann York MRN [id number]  Date of birth: May 08, 1965  CC & HPI:  Ann York is a 52 y.o. female presenting today for fibroids. She has not been seen by Dr. Glo Herring since 2011. She has a 3cm fibroid on her uterus. She has 3 children, first C section, second vaginal, third C section. She was treated for a UTI in July 2019.by [place] [facility])  On 10/17/2017, she went to a doctor in [place] and was given Sepra for a bladder infection. During her exams, she has severe pain that spreads to her back. She reports constant, severe bladder pain that prevents her from emptying her bladder. After she urinates, the pain still persists and Ibuprofen does not help. Her last bowel movement was yesterday There is pressure in the area and pt feels like something is dropping. She went to the ED for suprapubic pain on 10/31/2017 and was given Rx Pyridium tablet 100mg . She saw Dr. Dionisio Paschal  Urologist yesterday. A doctor in [place] [facility]) sent her to a bladder specialist, who thinks she has pelvic floor syndrome. The patient denies fever, chills or any other symptoms or complaints at this time.   ROS:  ROS  + pain with urination + constant bladder pain - fever - chills Constipation , has to strain to defecate All systems are negative except as noted in the HPI and PMH.   Pertinent History Reviewed:   Reviewed: Significant for C section x2, endometrial ablation, BTL Medical         Past Medical History:  Diagnosis Date  . Anxiety   . Hyperlipidemia   . Panic attacks   . Thyroid disease                               Surgical Hx:    Past Surgical History:  Procedure Laterality Date  . BREAST SURGERY     biopsy left breast  . CESAREAN SECTION     x 2  . ENDOMETRIAL ABLATION    . TUBAL LIGATION     Medications: Reviewed & Updated - see  associated section                       Current Outpatient Medications:  .  cephALEXin (KEFLEX) 500 MG capsule, Take 1 capsule (500 mg total) by mouth 4 (four) times daily., Disp: 28 capsule, Rfl: 0 .  ciprofloxacin (CIPRO) 500 MG tablet, Take 500 mg by mouth 2 (two) times daily. 7 day course starting on 10/29/2017, Disp: , Rfl: 0 .  ibuprofen (ADVIL,MOTRIN) 200 MG tablet, Take 800 mg by mouth every 6 (six) hours as needed for moderate pain., Disp: , Rfl:  .  levothyroxine (SYNTHROID, LEVOTHROID) 25 MCG tablet, Take 1 tablet by mouth daily., Disp: , Rfl: 1 .  phenazopyridine (PYRIDIUM) 200 MG tablet, Take 1 tablet (200 mg total) by mouth 3 (three) times daily as needed for pain., Disp: 6 tablet, Rfl: 0   Social History: Reviewed -  reports that she has quit smoking. Her smoking use included cigarettes. She has never used smokeless tobacco.  Objective Findings:  Vitals: Blood pressure 114/71, pulse 71, height 5\' 1"  (1.549 m), weight 160 lb (72.6  kg).  PHYSICAL EXAMINATION General appearance - alert, well appearing, and in no distress, oriented to person, place, and time and normal appearing weight Mental status - alert, oriented to person, place, and time, normal mood, behavior, speech, dress, motor activity, and thought processes, affect appropriate to mood  PELVIC External genitalia - 4 mm skin tag on left labia majora that has gotten a little bit bigger. Does not bother pt. , but she's considering excision. Vagina - well supported. Well healed episiotomy site. Cervix - deviated slightly to the left Urethral pressure causes stinging sensation Bladder pressure causes urge to pee Uterine manipulation causes pain all the way to the back, especially on left side where Pressure to left uterosacral ligaments causes the worst pain, ligaments are tighter on the left side than the right It is possible that the constipation is making pelvic difficult to assess. Assessment & Plan:   A:   1. Constipation 2. Pelvic discomfort, uncertain etiology 3. Urinary disfunction   P:  1. Rx Miralax BID until stools are loose, then decrease to 1x per day 2. F/U Tuesday 11/18/2017 for re-examination 3. Request all records from ED's.  By signing my name below, I, [name], attest that this documentation has been prepared under the direction and in the presence of Jonnie Kind, MD. Electronically Signed: [name], Medical Scribe. 11/13/17. 8:49 AM.  I personally performed the services described in this documentation, which was SCRIBED in my presence. The recorded information has been reviewed and considered accurate. It has been edited as necessary during review. Jonnie Kind, MD

## 2017-11-18 ENCOUNTER — Encounter: Payer: Self-pay | Admitting: Obstetrics and Gynecology

## 2017-11-18 ENCOUNTER — Ambulatory Visit (INDEPENDENT_AMBULATORY_CARE_PROVIDER_SITE_OTHER): Payer: BLUE CROSS/BLUE SHIELD | Admitting: Obstetrics and Gynecology

## 2017-11-18 ENCOUNTER — Other Ambulatory Visit (HOSPITAL_COMMUNITY)
Admission: RE | Admit: 2017-11-18 | Discharge: 2017-11-18 | Disposition: A | Discharge status: Home / Self Care | Payer: BLUE CROSS/BLUE SHIELD | Source: Ambulatory Visit | Attending: Obstetrics and Gynecology | Admitting: Obstetrics and Gynecology

## 2017-11-18 VITALS — BP 117/77 | HR 81 | Ht 61.0 in | Wt 158.6 lb

## 2017-11-18 DIAGNOSIS — R87612 Low grade squamous intraepithelial lesion on cytologic smear of cervix (LGSIL): Secondary | ICD-10-CM | POA: Diagnosis not present

## 2017-11-18 DIAGNOSIS — R102 Pelvic and perineal pain: Secondary | ICD-10-CM

## 2017-11-18 NOTE — Progress Notes (Signed)
   [facility] Visit  @DATE @            Patient name: Ann York MRN [id number]  Date of birth: December 05, 1965  CC & HPI:  Ann York is a 52 y.o. female presenting today for repeat examination after bowel evacuation.  She begin MiraLAX and feels much anterior.  She still has a sense of pelvic heaviness and pressure.  She is brought Dr. Alan Ripper urology records which will be scanned into epic   ROS:  ROS   Pertinent History Reviewed:   Reviewed: Significant for cesarean, then svd then C/s + Btl. Medical         Past Medical History:  Diagnosis Date  . Anxiety   . Hyperlipidemia   . Panic attacks   . Thyroid disease                               Surgical Hx:    Past Surgical History:  Procedure Laterality Date  . BREAST SURGERY     biopsy left breast  . CESAREAN SECTION     x 2  . ENDOMETRIAL ABLATION    . TUBAL LIGATION     Medications: Reviewed & Updated - see associated section                       Current Outpatient Medications:  .  ibuprofen (ADVIL,MOTRIN) 200 MG tablet, Take 800 mg by mouth every 6 (six) hours as needed for moderate pain., Disp: , Rfl:  .  levothyroxine (SYNTHROID, LEVOTHROID) 25 MCG tablet, Take 1 tablet by mouth daily., Disp: , Rfl: 1   Social History: Reviewed -  reports that she has quit smoking. Her smoking use included cigarettes. She has never used smokeless tobacco.  Objective Findings:  Vitals: Blood pressure 117/77, pulse 81, height 5\' 1"  (1.549 m), weight 158 lb 9.6 oz (71.9 kg).  PHYSICAL EXAMINATION General appearance - alert, well appearing, and in no distress Mental status - alert, oriented to person, place, and time, normal mood, behavior, speech, dress, motor activity, and thought processes Chest - clear to auscultation, no wheezes, rales or rhonchi, symmetric air entry Heart - normal rate and regular rhythm Abdomen - soft, nontender, nondistended, no masses or organomegaly Breasts -  Skin - normal coloration and  turgor, no rashes, no suspicious skin lesions noted  PELVIC External genitalia - normal Vulva - normal healing p episiotomy3 Vagina - good bladder support no pain with valsalva, and uterus well supported.3 Cervix - anterrio  Uterus - tender to touch, nonpurulent, HURTS TO TOUCH UTERUS ALL THE  WAY IN TO HER BACK.  Adnexa - NO MASSES, CERVIX IS SLIGHTLY TO LEFT OF MIDLINE [place] -  Rectal - normal rectal, no masses, no rectocele    Assessment & Plan:   A:  1. Pelvic discomfort associated with uterine contact rule out post ablation pelvic pain or hematometria 2.  Chronic constipation please have patient stay on MiraLAX P:  1. Transvaginal ultrasound ASAP, will discuss with patient with a laparoscopy possible hysterectomy should be considered 2. Pap done 3 results by phone

## 2017-11-21 LAB — CYTOLOGY - PAP
Chlamydia: NEGATIVE
HPV (WINDOPATH): NOT DETECTED
Neisseria Gonorrhea: NEGATIVE

## 2017-11-25 ENCOUNTER — Other Ambulatory Visit: Payer: Self-pay | Admitting: Obstetrics and Gynecology

## 2017-11-25 DIAGNOSIS — R102 Pelvic and perineal pain: Secondary | ICD-10-CM

## 2017-11-26 ENCOUNTER — Ambulatory Visit (INDEPENDENT_AMBULATORY_CARE_PROVIDER_SITE_OTHER): Payer: BLUE CROSS/BLUE SHIELD

## 2017-11-26 ENCOUNTER — Telehealth: Payer: Self-pay | Admitting: *Deleted

## 2017-11-26 DIAGNOSIS — R102 Pelvic and perineal pain: Secondary | ICD-10-CM | POA: Diagnosis not present

## 2017-11-26 NOTE — Progress Notes (Signed)
PELVIC US TA/TV:heterogeneous anteverted uterus w/mult.fibroids,largest fibroids: (#1) subserosal fibroid right 4 x 2.6 x 3.1 cm,(#2)fundal intramural fibroid 2.2 x 1.9 x 2.2 cm (#3) anterior subserosal fibroid 2.4 x 1.7 x 1.1 cm,EEC 6.2 mm,no free fluid,pt had a lot of pelvic pain during ultrasound

## 2017-11-26 NOTE — Telephone Encounter (Signed)
Spoke with pt explaining what a colpo entails. Pt is scheduled with Dr. Glo Herring on Wednesday. I advised he would explain everything before he does anything and answer any questions she may have. Pt concerned about the "c" word. I reassured pt to try not to be concerned about that right now. Pt voiced understanding. [place]

## 2017-12-03 ENCOUNTER — Other Ambulatory Visit: Payer: Self-pay | Admitting: Obstetrics and Gynecology

## 2017-12-03 ENCOUNTER — Ambulatory Visit (INDEPENDENT_AMBULATORY_CARE_PROVIDER_SITE_OTHER): Payer: BLUE CROSS/BLUE SHIELD | Admitting: Obstetrics and Gynecology

## 2017-12-03 ENCOUNTER — Other Ambulatory Visit: Payer: Self-pay

## 2017-12-03 ENCOUNTER — Encounter: Payer: Self-pay | Admitting: Obstetrics and Gynecology

## 2017-12-03 VITALS — BP 129/79 | HR 70 | Ht 61.0 in | Wt 160.0 lb

## 2017-12-03 DIAGNOSIS — N870 Mild cervical dysplasia: Secondary | ICD-10-CM | POA: Diagnosis not present

## 2017-12-03 DIAGNOSIS — Z3202 Encounter for pregnancy test, result negative: Secondary | ICD-10-CM

## 2017-12-03 LAB — POCT URINE PREGNANCY: PREG TEST UR: NEGATIVE

## 2017-12-03 NOTE — Progress Notes (Addendum)
Patient ID: Ann York, female   DOB: 1965/07/20, 52 y.o.   MRN: [id number]   ZIANA HEYLIGER 52 y.o. 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 pap smear on 11/18/17.  Discussed role for HPV in cervical dysplasia, need for surveillance.  Patient given informed consent, signed copy in the chart, time out was performed.  Placed in lithotomy position. Cervix viewed with speculum and colposcope after application of acetic acid.   Colposcopy adequate? Yes No visible lesions; biopsies obtained at 9 o'clock at area of possible minimal acetowhite change  ECC specimen obtained. All specimens were labelled and sent to pathology.   Colposcopy IMPRESSION:no visible dysplasia  Patient was given post procedure instructions. Will follow up pathology and manage accordingly.  Routine preventative health maintenance measures emphasized.  Plan: 1. F/u call with results of biopsies   By signing my name below, I, Samul Dada, attest that this documentation has been prepared under the direction and in the presence of Jonnie Kind, MD. Electronically Signed: [place]. 12/03/17. 9:26 AM.  I personally performed the services described in this documentation, which was SCRIBED in my presence. The recorded information has been reviewed and considered accurate. It has been edited as necessary during review. Jonnie Kind, MD

## 2017-12-17 ENCOUNTER — Telehealth: Payer: Self-pay | Admitting: *Deleted

## 2017-12-17 NOTE — Telephone Encounter (Signed)
Patient states she is having a heavy feeling in her lower abdomen and can feel her heart beat in vaginal area mostly when she sits or stands. She has never experienced this before.  She is concerned her bladder has dropped because she is voiding frequently.  Requesting to be seen today. Informed patient we did not have any openings today but will w/i tomorrow.  Verbalized understanding.

## 2017-12-18 ENCOUNTER — Ambulatory Visit (INDEPENDENT_AMBULATORY_CARE_PROVIDER_SITE_OTHER): Payer: BLUE CROSS/BLUE SHIELD | Admitting: Obstetrics and Gynecology

## 2017-12-18 ENCOUNTER — Encounter: Payer: Self-pay | Admitting: Obstetrics and Gynecology

## 2017-12-18 DIAGNOSIS — R350 Frequency of micturition: Secondary | ICD-10-CM

## 2017-12-18 LAB — POCT URINALYSIS DIPSTICK OB
Blood, UA: NEGATIVE
Glucose, UA: NEGATIVE
Ketones, UA: NEGATIVE
Leukocytes, UA: NEGATIVE
Nitrite, UA: NEGATIVE
POC,PROTEIN,UA: NEGATIVE

## 2017-12-18 NOTE — Progress Notes (Signed)
Patient ID: JOCI DRESS, female   DOB: 06-01-1965, 52 y.o.   MRN: [id number]    [facility] Visit  @DATE @            Patient name: Ann York MRN [id number]  Date of birth: 01-31-1966  CC & HPI:  Ann York is a 52 y.o. female [employer] IN [employer] presenting today for a heavy feeling lower abdomen and frequent urination for the past 3 days. She thinks her bladder has dropped. A few days ago, she had a bowel movement and noticed something hanging, so she pushed it back inside her. Her lower back hurts and she takes Ibuprofen everyday. She can feel her heartbeat in her pelvis only when she is standing or sitting, not when she is laying down. She hasn't noticed any physical changes in her body. Her hands and feet are cold all the time but don't turn blue.   Hx of C section 2x, BTL and ablation. She had a colpo on 12/03/2017 for LSIL and a CT in August. She has 4 fibroids, one is larger than the others. The patient denies fever, chills or any other symptoms or complaints at this time.   ROS:  ROS + lower back pain + heavy feeling in lower abdomen, tugging/pulling, pressure + frequent urination + 4 fibroids - fever - chills All systems are negative except as noted in the HPI and PMH.   Pertinent History Reviewed:   Reviewed: Medical         Past Medical History:  Diagnosis Date  . Anxiety   . Hyperlipidemia   . Panic attacks   . Thyroid disease                               Surgical Hx:    Past Surgical History:  Procedure Laterality Date  . BREAST SURGERY     biopsy left breast  . CESAREAN SECTION     x 2  . ENDOMETRIAL ABLATION    . TUBAL LIGATION     Medications: Reviewed & Updated - see associated section                       Current Outpatient Medications:  .  ibuprofen (ADVIL,MOTRIN) 200 MG tablet, Take 800 mg by mouth every 6 (six) hours as needed for moderate pain., Disp: , Rfl:  .  levothyroxine (SYNTHROID, LEVOTHROID) 25 MCG tablet, Take 1 tablet by mouth  daily., Disp: , Rfl: 1  Social History: Reviewed -  reports that she has quit smoking. Her smoking use included cigarettes. She has never used smokeless tobacco.  Objective Findings:  Vitals: Blood pressure 121/73, pulse 74, height 5\' 1"  (1.549 m), weight 161 lb (73 kg).  PHYSICAL EXAMINATION General appearance - alert, well appearing, and in no distress and oriented to person, place, and time Mental status - alert, oriented to person, place, and time, normal mood, behavior, speech, dress, motor activity, and thought processes. Affect appropriate to mood. Abdomen: normal blood flow, no masses.  PELVIC Pelvis - support is fine Vulva - 1cm skin tag on left labia majora, stable. Vagina - normal support. Atrophic tissues Cervix - well supported Uterus - anterior, well supported. Normal pulse. No masses. Ovaries: normal size  Assessment & Plan:   A: SENSE Of pelvic pressure with 1. Reviewed CT with pt 2. No identified pelvic abnormalities  3. Pt senses a  pelvic pressure 4. Normal physical exam  P:  1.  F/U with PCP  By signing my name below, I, [name], attest that this documentation has been prepared under the direction and in the presence of Jonnie Kind, MD. Electronically Signed: [name], Medical Scribe. 12/18/17. 10:44 AM.  .

## 2017-12-24 ENCOUNTER — Ambulatory Visit: Payer: BLUE CROSS/BLUE SHIELD | Admitting: Obstetrics and Gynecology

## 2018-01-13 ENCOUNTER — Other Ambulatory Visit (HOSPITAL_COMMUNITY): Payer: Self-pay | Admitting: Physician Assistant

## 2018-01-13 ENCOUNTER — Ambulatory Visit (HOSPITAL_COMMUNITY)
Admission: RE | Admit: 2018-01-13 | Discharge: 2018-01-13 | Disposition: A | Discharge status: Home / Self Care | Payer: BLUE CROSS/BLUE SHIELD | Source: Ambulatory Visit | Attending: Physician Assistant | Admitting: Physician Assistant

## 2018-01-13 DIAGNOSIS — R631 Polydipsia: Secondary | ICD-10-CM | POA: Insufficient documentation

## 2018-02-19 ENCOUNTER — Encounter (HOSPITAL_COMMUNITY): Payer: Self-pay | Admitting: Emergency Medicine

## 2018-02-19 ENCOUNTER — Emergency Department (HOSPITAL_COMMUNITY): Payer: BLUE CROSS/BLUE SHIELD

## 2018-02-19 ENCOUNTER — Other Ambulatory Visit: Payer: Self-pay

## 2018-02-19 ENCOUNTER — Emergency Department (HOSPITAL_COMMUNITY)
Admission: EM | Admit: 2018-02-19 | Discharge: 2018-02-19 | Disposition: A | Discharge status: Home / Self Care | Payer: BLUE CROSS/BLUE SHIELD | Attending: Emergency Medicine | Admitting: Emergency Medicine

## 2018-02-19 DIAGNOSIS — X500XXA Overexertion from strenuous movement or load, initial encounter: Secondary | ICD-10-CM | POA: Diagnosis not present

## 2018-02-19 DIAGNOSIS — Z87891 Personal history of nicotine dependence: Secondary | ICD-10-CM | POA: Insufficient documentation

## 2018-02-19 DIAGNOSIS — Z79899 Other long term (current) drug therapy: Secondary | ICD-10-CM | POA: Diagnosis not present

## 2018-02-19 DIAGNOSIS — Y929 Unspecified place or not applicable: Secondary | ICD-10-CM | POA: Diagnosis not present

## 2018-02-19 DIAGNOSIS — Y999 Unspecified external cause status: Secondary | ICD-10-CM | POA: Insufficient documentation

## 2018-02-19 DIAGNOSIS — S92215A Nondisplaced fracture of cuboid bone of left foot, initial encounter for closed fracture: Secondary | ICD-10-CM | POA: Insufficient documentation

## 2018-02-19 DIAGNOSIS — S99922A Unspecified injury of left foot, initial encounter: Secondary | ICD-10-CM | POA: Diagnosis present

## 2018-02-19 DIAGNOSIS — Y9301 Activity, walking, marching and hiking: Secondary | ICD-10-CM | POA: Diagnosis not present

## 2018-02-19 MED ORDER — IBUPROFEN 400 MG PO TABS
400.0000 mg | ORAL_TABLET | Freq: Once | ORAL | Status: AC
  Administered 2018-02-19: 400 mg via ORAL
  Filled 2018-02-19: qty 1

## 2018-02-19 MED ORDER — TRAMADOL HCL 50 MG PO TABS: ORAL_TABLET | ORAL | 0 refills | Status: DC

## 2018-02-19 MED ORDER — ACETAMINOPHEN 500 MG PO TABS
1000.0000 mg | ORAL_TABLET | Freq: Once | ORAL | Status: AC
  Administered 2018-02-19: 1000 mg via ORAL
  Filled 2018-02-19: qty 2

## 2018-02-19 NOTE — Discharge Instructions (Addendum)
Your x-ray and your exam suggest an avulsion fracture of the bones behind the fourth and fifth toe call the cuboid.  Please keep your feet above your waist when you are sitting and above your heart when you are lying down flat over the next 2 to 3 days.  Apply an ice pack.  Please use your ankle stirrup splint when you are up and about.  This fits in your shoe.  Please use 400 mg of ibuprofen and 1000 mg of Tylenol with breakfast, lunch, dinner, and at bedtime.  May use Ultram for more severe pain.This medication may cause drowsiness. Please do not drink, drive, or participate in activity that requires concentration while taking this medication.  Please call Dr. Aline Brochure, or the orthopedic specialist of your choice for orthopedic management of this fracture.  Please make this call on tomorrow or as soon as possible.

## 2018-02-19 NOTE — ED Triage Notes (Signed)
Pt reports taking a mistep this morning and rolled her LT foot. C/o pain and swelling to lateral anterior LT foot. Pt ambulatory.

## 2018-02-19 NOTE — ED Provider Notes (Signed)
[name] EMERGENCY DEPARTMENT Provider Note   CSN: [id number] Arrival date & time: 02/19/18  1649     History   Chief Complaint Chief Complaint  Patient presents with  . Foot Pain    HPI Ann York is a 52 y.o. female.  Patient is a 52 year old female who presents to the emergency department with a complaint of left foot pain.  The patient states that approximately 11 AM this morning she made a misstep, and rolled her left foot.  Since that time she has been having pain of the top and lateral portion of her foot, and she is noticed more and more swelling.  No previous operations involving this area.  No other injuries reported.    The history is provided by the patient.  Foot Pain  Pertinent negatives include no chest pain, no abdominal pain and no shortness of breath.    Past Medical History:  Diagnosis Date  . Anxiety   . Hyperlipidemia   . Panic attacks   . Thyroid disease     Patient Active Problem List   Diagnosis Date Noted  . GERD 02/01/2010  . DYSPEPSIA 02/01/2010  . HAND PAIN 06/23/2008    Past Surgical History:  Procedure Laterality Date  . BREAST SURGERY     biopsy left breast  . CESAREAN SECTION     x 2  . ENDOMETRIAL ABLATION    . TUBAL LIGATION       OB History    Gravida  4   Para  3   Term  3   Preterm      AB  1   Living  3     SAB  1   TAB      Ectopic      Multiple      Live Births               Home Medications    Prior to Admission medications   Medication Sig Start Date End Date Taking? Authorizing Provider  ibuprofen (ADVIL,MOTRIN) 200 MG tablet Take 800 mg by mouth every 6 (six) hours as needed for moderate pain.    [provider]  levothyroxine (SYNTHROID, LEVOTHROID) 25 MCG tablet Take 1 tablet by mouth daily. 12/30/16   [provider]  esomeprazole (NEXIUM) 40 MG capsule Take 40 mg by mouth daily before breakfast.    07/01/11  [provider]  sertraline (ZOLOFT) 50 MG  tablet Take 50 mg by mouth daily.    07/01/11  [provider]    Family History Family History  Problem Relation Age of Onset  . Rheum arthritis Mother   . Hypertension Mother   . Thyroid disease Mother   . Stroke Mother   . Hypertension Father   . Heart disease Father   . Hypertension Sister   . Thyroid disease Sister   . Hypertension Brother     Social History Social History   Tobacco Use  . Smoking status: Former Smoker    Types: Cigarettes  . Smokeless tobacco: Never Used  Substance Use Topics  . Alcohol use: No  . Drug use: No     Allergies   Septra [sulfamethoxazole-trimethoprim]   Review of Systems Review of Systems  Constitutional: Negative for activity change.       All ROS Neg except as noted in HPI  HENT: Negative for nosebleeds.   Eyes: Negative for photophobia and discharge.  Respiratory: Negative for cough, shortness of breath and wheezing.  Cardiovascular: Negative for chest pain and palpitations.  Gastrointestinal: Negative for abdominal pain and blood in stool.  Genitourinary: Negative for dysuria, frequency and hematuria.  Musculoskeletal: Negative for arthralgias, back pain and neck pain.       Foot pain  Skin: Negative.   Neurological: Negative for dizziness, seizures and speech difficulty.  Psychiatric/Behavioral: Negative for confusion and hallucinations.     Physical Exam Updated Vital Signs BP 126/69 (BP Location: Right Arm)   Pulse 77   Temp 98.4 F (36.9 C) (Oral)   Resp 14   Ht 5\' 1"  (1.549 m)   Wt 71.2 kg   SpO2 98%   BMI 29.66 kg/m   Physical Exam  Constitutional: She is oriented to person, place, and time. She appears well-developed and well-nourished.  Non-toxic appearance.  HENT:  Head: Normocephalic.  Right Ear: Tympanic membrane and external ear normal.  Left Ear: Tympanic membrane and external ear normal.  Eyes: Pupils are equal, round, and reactive to light. EOM and lids are normal.  Neck: Normal  range of motion. Neck supple. Carotid bruit is not present.  Cardiovascular: Normal rate, regular rhythm, normal heart sounds, intact distal pulses and normal pulses.  Pulmonary/Chest: Breath sounds normal. No respiratory distress.  Abdominal: Soft. Bowel sounds are normal. There is no tenderness. There is no guarding.  Musculoskeletal: Normal range of motion. She exhibits tenderness.       Left foot: There is tenderness and swelling.       Feet:  Capillary refill is less than 2 seconds.  Dorsalis pedis pulses 2+.  There is bruising and some swelling of the lateral portion of the left foot.  Tender to palpation, and tender to attempted movement.  Lymphadenopathy:       Head (right side): No submandibular adenopathy present.       Head (left side): No submandibular adenopathy present.    She has no cervical adenopathy.  Neurological: She is alert and oriented to person, place, and time. She has normal strength. No cranial nerve deficit or sensory deficit.  Skin: Skin is warm and dry.  Psychiatric: She has a normal mood and affect. Her speech is normal.  Nursing note and vitals reviewed.    ED Treatments / Results  Labs (all labs ordered are listed, but only abnormal results are displayed) Labs Reviewed - No data to display  EKG None  Radiology No results found.  Procedures Procedures (including critical care time)  FRACTURE CARE LEFT FOOT. Patient is a 52 year old female who missed a step earlier this morning had a severe twisting of her foot and injured the left foot.  X-ray shows an avulsion fracture of the cuboid area.  I discussed the fracture with the patient in terms which she understands.  I discussed with her the need for mobilization.  Patient gives permission.  Patient identified by armband.  Procedural timeout taken.  Patient fitted with an ankle stirrup splint.  Ice pack provided.  Ibuprofen given for pain and discomfort.  After application of the splint, the  patient denies the splint feeling too tight.  There are no temperature changes appreciated.  Patient tolerated the procedure without problem. Medications Ordered in ED Medications - No data to display   Initial Impression / Assessment and Plan / ED Course  I have reviewed the triage vital signs and the nursing notes.  Pertinent labs & imaging results that were available during my care of the patient were reviewed by me and considered in my medical decision  making (see chart for details).       Final Clinical Impressions(s) / ED Diagnoses MDM  Vital signs reviewed.  Pulse oximetry is 98% on room air.  Within normal limits by my interpretation.  No neurological vascular deficits appreciated on the examination.  No evidence of dislocation. X-ray questions and avulsion fracture and involving the cuboid on the left.  Patient will be fitted with an ankle stirrup splint.  The patient refuses crutches at this time.  An ice pack will be provided for the patient.  Patient to follow-up with Dr. Aline Brochure for orthopedic evaluation and management.   Final diagnoses:  Closed nondisplaced fracture of cuboid of left foot, initial encounter    ED Discharge Orders         Ordered    traMADol (ULTRAM) 50 MG tablet     02/19/18 1819           Lily Kocher, PA-C 02/19/18 1832    Maudie Flakes, MD 02/20/18 [phone]

## 2018-02-19 NOTE — ED Notes (Signed)
ASO, ice pack and post op shoe applied, Patient given discharge instruction, verbalized understand. Patient wheelchair out of the department.

## 2018-02-19 NOTE — ED Notes (Signed)
Going to xray 

## 2018-02-25 ENCOUNTER — Ambulatory Visit: Payer: BLUE CROSS/BLUE SHIELD | Admitting: Orthopaedic Surgery

## 2018-12-31 ENCOUNTER — Other Ambulatory Visit (HOSPITAL_COMMUNITY): Payer: Self-pay | Admitting: Family Medicine

## 2018-12-31 DIAGNOSIS — Z1231 Encounter for screening mammogram for malignant neoplasm of breast: Secondary | ICD-10-CM

## 2019-01-07 ENCOUNTER — Ambulatory Visit (HOSPITAL_COMMUNITY)
Admission: RE | Admit: 2019-01-07 | Discharge: 2019-01-07 | Disposition: A | Discharge status: Home / Self Care | Payer: BC Managed Care – PPO | Source: Ambulatory Visit | Attending: Family Medicine | Admitting: Family Medicine

## 2019-01-07 ENCOUNTER — Other Ambulatory Visit: Payer: Self-pay

## 2019-01-07 ENCOUNTER — Ambulatory Visit (HOSPITAL_COMMUNITY): Payer: BLUE CROSS/BLUE SHIELD

## 2019-01-07 DIAGNOSIS — Z1231 Encounter for screening mammogram for malignant neoplasm of breast: Secondary | ICD-10-CM | POA: Diagnosis present

## 2019-01-26 ENCOUNTER — Encounter (HOSPITAL_COMMUNITY): Payer: Self-pay

## 2019-01-26 ENCOUNTER — Other Ambulatory Visit: Payer: Self-pay

## 2019-01-26 ENCOUNTER — Emergency Department (HOSPITAL_COMMUNITY): Payer: BC Managed Care – PPO

## 2019-01-26 ENCOUNTER — Emergency Department (HOSPITAL_COMMUNITY)
Admission: EM | Admit: 2019-01-26 | Discharge: 2019-01-26 | Disposition: A | Discharge status: Home / Self Care | Payer: BC Managed Care – PPO | Attending: Emergency Medicine | Admitting: Emergency Medicine

## 2019-01-26 DIAGNOSIS — Z87891 Personal history of nicotine dependence: Secondary | ICD-10-CM | POA: Insufficient documentation

## 2019-01-26 DIAGNOSIS — Z79899 Other long term (current) drug therapy: Secondary | ICD-10-CM | POA: Insufficient documentation

## 2019-01-26 DIAGNOSIS — R0789 Other chest pain: Secondary | ICD-10-CM | POA: Diagnosis not present

## 2019-01-26 DIAGNOSIS — R7989 Other specified abnormal findings of blood chemistry: Secondary | ICD-10-CM | POA: Diagnosis not present

## 2019-01-26 HISTORY — DX: Gastro-esophageal reflux disease without esophagitis: K21.9

## 2019-01-26 LAB — CBC WITH DIFFERENTIAL/PLATELET
Abs Immature Granulocytes: 0.02 10*3/uL (ref 0.00–0.07)
Basophils Absolute: 0.1 10*3/uL (ref 0.0–0.1)
Basophils Relative: 1 %
Eosinophils Absolute: 0.1 10*3/uL (ref 0.0–0.5)
Eosinophils Relative: 1 %
HCT: 45.1 % (ref 36.0–46.0)
Hemoglobin: 14 g/dL (ref 12.0–15.0)
Immature Granulocytes: 0 %
Lymphocytes Relative: 25 %
Lymphs Abs: 1.7 10*3/uL (ref 0.7–4.0)
MCH: 28.4 pg (ref 26.0–34.0)
MCHC: 31 g/dL (ref 30.0–36.0)
MCV: 91.5 fL (ref 80.0–100.0)
Monocytes Absolute: 0.5 10*3/uL (ref 0.1–1.0)
Monocytes Relative: 8 %
Neutro Abs: 4.3 10*3/uL (ref 1.7–7.7)
Neutrophils Relative %: 65 %
Platelets: 286 10*3/uL (ref 150–400)
RBC: 4.93 MIL/uL (ref 3.87–5.11)
RDW: 12.9 % (ref 11.5–15.5)
WBC: 6.7 10*3/uL (ref 4.0–10.5)
nRBC: 0 % (ref 0.0–0.2)

## 2019-01-26 LAB — COMPREHENSIVE METABOLIC PANEL
ALT: 55 U/L — ABNORMAL HIGH (ref 0–44)
AST: 26 U/L (ref 15–41)
Albumin: 4 g/dL (ref 3.5–5.0)
Alkaline Phosphatase: 74 U/L (ref 38–126)
Anion gap: 8 (ref 5–15)
BUN: 15 mg/dL (ref 6–20)
CO2: 26 mmol/L (ref 22–32)
Calcium: 8.9 mg/dL (ref 8.9–10.3)
Chloride: 100 mmol/L (ref 98–111)
Creatinine, Ser: 0.6 mg/dL (ref 0.44–1.00)
GFR calc Af Amer: >60 mL/min (ref >60)
GFR calc non Af Amer: >60 mL/min (ref >60)
Glucose, Bld: 109 mg/dL — ABNORMAL HIGH (ref 70–99)
Potassium: 3.5 mmol/L (ref 3.5–5.1)
Sodium: 134 mmol/L — ABNORMAL LOW (ref 135–145)
Total Bilirubin: 0.5 mg/dL (ref 0.3–1.2)
Total Protein: 7.5 g/dL (ref 6.5–8.1)

## 2019-01-26 LAB — TROPONIN I (HIGH SENSITIVITY)
Troponin I (High Sensitivity): <2 ng/L (ref <18)
Troponin I (High Sensitivity): <2 ng/L (ref <18)

## 2019-01-26 LAB — TSH: TSH: 7.452 u[IU]/mL — ABNORMAL HIGH (ref 0.350–4.500)

## 2019-01-26 MED ORDER — ASPIRIN 81 MG PO CHEW
324.0000 mg | CHEWABLE_TABLET | Freq: Once | ORAL | Status: AC
  Administered 2019-01-26: 16:00:00 324 mg via ORAL
  Filled 2019-01-26: qty 4

## 2019-01-26 NOTE — ED Provider Notes (Signed)
[facility] EMERGENCY DEPARTMENT Provider Note   CSN: [id number] Arrival date & time: 01/26/19  1404     History   Chief Complaint Chief Complaint  Patient presents with  . Chest Pain    HPI Ann York is a 53 y.o. female with a history of anxiety, GERD, hyperlipidemia and h/o hypothyroidism (but taken off levothyroxine last year) presenting with day 3 of intermittent anterior chest pressure/tightness sensation, initially with radiation into her bilateral neck, now just affecting her upper anterior chest.  She initially woke with sx 3 mornings ago which improved after taking tylenol. The initial episode was accompanied by diaphoresis and palpitations.   Her symptoms were improved yesterday but still present, completed some simple stretching exercises of her neck and arms yesterday, then woke today with increased pressure again in the upper chest but not radiating to the neck. She suspects muscle strain (works at [employer]) so took tylenol and applied a muscle rub to her chest which seemed to make the tightness worse.  She denies sob, cough,  n/v, no diaphoresis or palpitations today.  She has no pain or swelling in her extremities, no exogenous estrogen or risk factors for PE. She reports increased episodes of acid reflux recently for which she takes Nexium.   HPI: A 53 year old patient with a history of hypercholesterolemia presents for evaluation of chest pain. Initial onset of pain was more than 6 hours ago. The patient's chest pain is described as heaviness/pressure/tightness and is not worse with exertion. The patient reports some diaphoresis. The patient's chest pain is middle- or left-sided, is not well-localized, is not sharp and does radiate to the arms/jaw/neck. The patient does not complain of nausea. The patient has no history of stroke, has no history of peripheral artery disease, has not smoked in the past 90 days, denies any history of treated diabetes, has no relevant  family history of coronary artery disease (first degree relative at less than age 52), is not hypertensive and does not have an elevated BMI (>=30).   The history is provided by the patient.    Past Medical History:  Diagnosis Date  . Anxiety   . GERD (gastroesophageal reflux disease)   . Hyperlipidemia   . Panic attacks   . Thyroid disease     Patient Active Problem List   Diagnosis Date Noted  . GERD 02/01/2010  . DYSPEPSIA 02/01/2010  . HAND PAIN 06/23/2008    Past Surgical History:  Procedure Laterality Date  . BREAST SURGERY     biopsy left breast  . CESAREAN SECTION     x 2  . ENDOMETRIAL ABLATION    . TUBAL LIGATION       OB History    Gravida  4   Para  3   Term  3   Preterm      AB  1   Living  3     SAB  1   TAB      Ectopic      Multiple      Live Births               Home Medications    Prior to Admission medications   Medication Sig Start Date End Date Taking? Authorizing Provider  acetaminophen (TYLENOL) 500 MG tablet Take 500 mg by mouth every 6 (six) hours as needed for mild pain or moderate pain.   Yes [provider]  Menthol-Methyl Salicylate (MUSCLE RUB) 10-15 % CREA Apply 1  application topically as needed for muscle pain.   Yes [provider]  VITAMIN D PO Take 1 capsule by mouth daily.   Yes [provider]  esomeprazole (NEXIUM) 40 MG capsule Take 40 mg by mouth daily before breakfast.    07/01/11  [provider]  sertraline (ZOLOFT) 50 MG tablet Take 50 mg by mouth daily.    07/01/11  [provider]    Family History Family History  Problem Relation Age of Onset  . Rheum arthritis Mother   . Hypertension Mother   . Thyroid disease Mother   . Stroke Mother   . Hypertension Father   . Heart disease Father   . Hypertension Sister   . Thyroid disease Sister   . Hypertension Brother     Social History Social History   Tobacco Use  . Smoking status: Former Smoker     Types: Cigarettes  . Smokeless tobacco: Never Used  Substance Use Topics  . Alcohol use: No  . Drug use: No     Allergies   Septra [sulfamethoxazole-trimethoprim]   Review of Systems Review of Systems  Constitutional: Positive for diaphoresis. Negative for activity change, appetite change and fever.  HENT: Negative for congestion and sore throat.   Eyes: Negative.   Respiratory: Positive for chest tightness. Negative for cough, shortness of breath and wheezing.   Cardiovascular: Positive for palpitations. Negative for chest pain.  Gastrointestinal: Negative for abdominal pain, nausea and vomiting.  Genitourinary: Negative.   Musculoskeletal: Negative for joint swelling.  Skin: Negative.  Negative for rash and wound.  Neurological: Negative for dizziness, weakness, light-headedness, numbness and headaches.  Psychiatric/Behavioral: Negative.      Physical Exam Updated Vital Signs BP (!) 112/97   Pulse 77   Temp 98.8 F (37.1 C) (Oral)   Resp 18   Ht 5\' 1"  (1.549 m)   Wt 74.8 kg   SpO2 100%   BMI 31.18 kg/m   Physical Exam Vitals signs and nursing note reviewed.  Constitutional:      Appearance: She is well-developed.  HENT:     Head: Normocephalic and atraumatic.  Eyes:     Conjunctiva/sclera: Conjunctivae normal.  Neck:     Musculoskeletal: Normal range of motion.  Cardiovascular:     Rate and Rhythm: Normal rate and regular rhythm.     Heart sounds: Normal heart sounds.  Pulmonary:     Effort: Pulmonary effort is normal.     Breath sounds: Normal breath sounds. No wheezing, rhonchi or rales.  Chest:     Chest wall: Tenderness present.     Comments: Mild reproducible tenderness upper bilateral chest and sternum.  No crepitus, edema, erythema, deformity.  Abdominal:     General: Bowel sounds are normal.     Palpations: Abdomen is soft.     Tenderness: There is no abdominal tenderness.  Musculoskeletal: Normal range of motion.  Skin:    General: Skin  is warm and dry.  Neurological:     Mental Status: She is alert.      ED Treatments / Results  Labs (all labs ordered are listed, but only abnormal results are displayed) Labs Reviewed  COMPREHENSIVE METABOLIC PANEL - Abnormal; Notable for the following components:      Result Value   Sodium 134 (*)    Glucose, Bld 109 (*)    ALT 55 (*)    All other components within normal limits  TSH - Abnormal; Notable for the following components:  TSH 7.452 (*)    All other components within normal limits  CBC WITH DIFFERENTIAL/PLATELET  TROPONIN I (HIGH SENSITIVITY)    EKG EKG Interpretation  Date/Time:  Tuesday January 26 2019 14:19:15 EDT Ventricular Rate:  97 PR Interval:    QRS Duration: 83 QT Interval:  345 QTC Calculation: 439 R Axis:   10 Text Interpretation:  Sinus rhythm No old tracing to compare Normal ECG Confirmed by Noemi Chapel [phone]) on 01/26/2019 3:24:38 PM   Radiology Dg Chest Portable 1 View  Result Date: 01/26/2019 CLINICAL DATA:  Chest tightness EXAM: PORTABLE CHEST 1 VIEW COMPARISON:  01/13/2018 FINDINGS: The heart size and mediastinal contours are within normal limits. Both lungs are clear. The visualized skeletal structures are unremarkable. IMPRESSION: No acute abnormality of the lungs in AP portable projection. Electronically Signed   By: Eddie Candle M.D.   On: 01/26/2019 14:56    Procedures Procedures (including critical care time)  Medications Ordered in ED Medications  aspirin chewable tablet 324 mg (324 mg Oral Given 01/26/19 1530)     Initial Impression / Assessment and Plan / ED Course  I have reviewed the triage vital signs and the nursing notes.  Pertinent labs & imaging results that were available during my care of the patient were reviewed by me and considered in my medical decision making (see chart for details).     HEAR Score: 4  Pt with non exdertional but reproducible intermittent chest tightness without sob x 3 days.   PT  with heart score of 4, pending delta troponins.  Given reproducible nature of sx, if delta troponins are negative, would favor chest wall pain as diagnosis with close outpatient f/u.    Discussed with PA Caccavale who assumes care.   Final Clinical Impressions(s) / ED Diagnoses   Final diagnoses:  None    ED Discharge Orders    None       Landis Martins 01/26/19 1609    Varney Biles, MD 01/26/19 2000

## 2019-01-26 NOTE — ED Triage Notes (Signed)
Pt reports she did some arm and neck exercises yesterday and noticed soreness in chest last night.  This morning had some soreness in neck and upper back.  Pt took tylenol and applied a muscle rub.  Reports approx 1hr ago started having tightness in chest and neck.  Also reports has been dealing with a lot of acid reflux.  Has sensation of something in throat.  Denies any cough, fever, or sob.

## 2019-01-26 NOTE — Discharge Instructions (Addendum)
Follow-up with your primary care doctor as needed for further evaluation of your symptoms. Continues Tylenol or ibuprofen as needed for pain. Return to the emergency room with any new, worsening, or concerning symptoms.

## 2019-01-26 NOTE — ED Provider Notes (Signed)
Patient signed out to me by J Idol, PA-C.  Please see previous notes for further history.  In brief, patient presenting for evaluation of chest pain which began on Sunday.  Symptoms are reproducible with palpation of the chest wall.  Improved with Tylenol.  Patient has had worsening GERD and anxiety recently.  No fever, cough, or infectious symptoms.  Pain is described as a tightness/pressure.  Delta troponin and TSH pending.  Repeat troponin negative.  TSH elevated, indicating patient may still have hypothyroidism.  Patient encouraged to follow-up with her primary care doctor for further management of her thyroid and GERD.  Discussed that I do not leave her pain is cardiac at this time.  At this time, patient received a discharge.  In terms of her symptom.  Patient states he understands agrees plan.   Franchot Heidelberg, PA-C 01/26/19 2233    Varney Biles, MD 01/28/19 1821

## 2019-02-09 ENCOUNTER — Other Ambulatory Visit: Payer: Self-pay | Admitting: Family Medicine

## 2019-02-09 ENCOUNTER — Other Ambulatory Visit (HOSPITAL_COMMUNITY): Payer: Self-pay | Admitting: Family Medicine

## 2019-02-09 DIAGNOSIS — E039 Hypothyroidism, unspecified: Secondary | ICD-10-CM

## 2019-02-16 ENCOUNTER — Ambulatory Visit (HOSPITAL_COMMUNITY)
Admission: RE | Admit: 2019-02-16 | Discharge: 2019-02-16 | Disposition: A | Discharge status: Home / Self Care | Payer: BC Managed Care – PPO | Source: Ambulatory Visit | Attending: Family Medicine | Admitting: Family Medicine

## 2019-02-16 ENCOUNTER — Other Ambulatory Visit: Payer: Self-pay

## 2019-02-16 DIAGNOSIS — E039 Hypothyroidism, unspecified: Secondary | ICD-10-CM | POA: Diagnosis present

## 2019-02-18 ENCOUNTER — Ambulatory Visit (HOSPITAL_COMMUNITY): Payer: BC Managed Care – PPO

## 2019-02-18 ENCOUNTER — Other Ambulatory Visit: Payer: BLUE CROSS/BLUE SHIELD | Admitting: Obstetrics and Gynecology

## 2019-05-25 ENCOUNTER — Other Ambulatory Visit: Admission: RE | Admit: 2019-05-25 | Payer: BC Managed Care – PPO | Source: Ambulatory Visit

## 2019-05-27 ENCOUNTER — Encounter: Admission: RE | Payer: Self-pay | Source: Home / Self Care

## 2019-05-27 ENCOUNTER — Ambulatory Visit: Admission: RE | Admit: 2019-05-27 | Payer: BC Managed Care – PPO | Source: Home / Self Care | Admitting: Urology

## 2019-05-27 SURGERY — LITHOTRIPSY, ESWL
Anesthesia: Moderate Sedation | Laterality: Left

## 2019-10-28 ENCOUNTER — Ambulatory Visit: Payer: BC Managed Care – PPO | Admitting: Urology

## 2020-01-12 ENCOUNTER — Encounter: Payer: Self-pay | Admitting: Internal Medicine

## 2020-01-17 ENCOUNTER — Other Ambulatory Visit (HOSPITAL_COMMUNITY): Payer: Self-pay | Admitting: Family Medicine

## 2020-01-17 DIAGNOSIS — Z1231 Encounter for screening mammogram for malignant neoplasm of breast: Secondary | ICD-10-CM

## 2020-01-22 ENCOUNTER — Emergency Department (HOSPITAL_COMMUNITY): Payer: BC Managed Care – PPO

## 2020-01-22 ENCOUNTER — Emergency Department (HOSPITAL_COMMUNITY)
Admission: EM | Admit: 2020-01-22 | Discharge: 2020-01-22 | Disposition: A | Discharge status: Home / Self Care | Payer: BC Managed Care – PPO | Attending: Emergency Medicine | Admitting: Emergency Medicine

## 2020-01-22 ENCOUNTER — Ambulatory Visit
Admission: EM | Admit: 2020-01-22 | Discharge: 2020-01-22 | Disposition: A | Discharge status: Home / Self Care | Payer: BC Managed Care – PPO

## 2020-01-22 ENCOUNTER — Encounter (HOSPITAL_COMMUNITY): Payer: Self-pay

## 2020-01-22 ENCOUNTER — Encounter: Payer: Self-pay | Admitting: Emergency Medicine

## 2020-01-22 ENCOUNTER — Other Ambulatory Visit: Payer: Self-pay

## 2020-01-22 DIAGNOSIS — Z87891 Personal history of nicotine dependence: Secondary | ICD-10-CM | POA: Insufficient documentation

## 2020-01-22 DIAGNOSIS — R079 Chest pain, unspecified: Secondary | ICD-10-CM

## 2020-01-22 DIAGNOSIS — R0789 Other chest pain: Secondary | ICD-10-CM | POA: Diagnosis not present

## 2020-01-22 DIAGNOSIS — R0989 Other specified symptoms and signs involving the circulatory and respiratory systems: Secondary | ICD-10-CM

## 2020-01-22 DIAGNOSIS — I2510 Atherosclerotic heart disease of native coronary artery without angina pectoris: Secondary | ICD-10-CM | POA: Insufficient documentation

## 2020-01-22 LAB — CBC
HCT: 43.5 % (ref 36.0–46.0)
Hemoglobin: 14.5 g/dL (ref 12.0–15.0)
MCH: 29.5 pg (ref 26.0–34.0)
MCHC: 33.3 g/dL (ref 30.0–36.0)
MCV: 88.4 fL (ref 80.0–100.0)
Platelets: 287 10*3/uL (ref 150–400)
RBC: 4.92 MIL/uL (ref 3.87–5.11)
RDW: 12.8 % (ref 11.5–15.5)
WBC: 7.7 10*3/uL (ref 4.0–10.5)
nRBC: 0 % (ref 0.0–0.2)

## 2020-01-22 LAB — BASIC METABOLIC PANEL
Anion gap: 15 (ref 5–15)
BUN: 16 mg/dL (ref 6–20)
CO2: 26 mmol/L (ref 22–32)
Calcium: 9.7 mg/dL (ref 8.9–10.3)
Chloride: 99 mmol/L (ref 98–111)
Creatinine, Ser: 0.54 mg/dL (ref 0.44–1.00)
GFR, Estimated: >60 mL/min (ref >60)
Glucose, Bld: 92 mg/dL (ref 70–99)
Potassium: 3.7 mmol/L (ref 3.5–5.1)
Sodium: 140 mmol/L (ref 135–145)

## 2020-01-22 LAB — POCT URINALYSIS DIP (MANUAL ENTRY)
Bilirubin, UA: NEGATIVE
Glucose, UA: NEGATIVE mg/dL
Ketones, POC UA: NEGATIVE mg/dL
Leukocytes, UA: NEGATIVE
Nitrite, UA: NEGATIVE
Protein Ur, POC: NEGATIVE mg/dL
Spec Grav, UA: 1.02 (ref 1.010–1.025)
Urobilinogen, UA: 0.2 E.U./dL
pH, UA: 6 (ref 5.0–8.0)

## 2020-01-22 LAB — TROPONIN I (HIGH SENSITIVITY): Troponin I (High Sensitivity): <2 ng/L (ref <18)

## 2020-01-22 NOTE — ED Triage Notes (Signed)
Pt to er, pt states that she is here for chest pain and throat pain, states that she was seen up in [place] and they couldn't find anything, states that she went to her pmd and has referrals to gi and cards, states that she appointments aren't for several weeks.  States that she is stressed at work and the pain seems to come and go.  States that today was a little worse, but is getting better now.

## 2020-01-22 NOTE — ED Triage Notes (Addendum)
Tightness that starts in front of neck/throat that radiates down to chest, chest soreness and her chest feels hot x 2 weeks.  Pt states she thinks it could be anxiety, she has been under a lot of stress lately.  Became tearful during triage. Pt states her throat has felt like this before and It was related to her acid reflux. Also reports some burning with urination x a few days.

## 2020-01-22 NOTE — ED Notes (Signed)
Pt states she wants to wait and have ekg done at the hospital.

## 2020-01-22 NOTE — ED Notes (Signed)
Patient is being discharged from the Urgent Care and sent to the Emergency Department via pov . Per B Wurst, patient is in need of higher level of care due to chest pain. Patient is aware and verbalizes understanding of plan of care. There were no vitals filed for this visit.

## 2020-01-22 NOTE — Discharge Instructions (Addendum)
Your lab tests, EKG and chest x-ray are reassuring tonight.  However as discussed, we do feel it is important for you to have close follow-up with the cardiologist for consideration of an outpatient stress test to confirm heart health.  Call the group listed above which is our local cardiologist in the hopes of getting your appointment moved up.  Let them know you were here this evening and would like you to get seen within the next 1 to 2 weeks.  In the interim I recommend taking an anti-inflammatory such as Aleve or ibuprofen as I suspect your tactile chest wall pain is secondary to muscle strain.  You may also try heating pad to your chest for 20 minute increments several times daily.

## 2020-01-22 NOTE — ED Provider Notes (Signed)
[facility]   [id number] 01/22/20 Arrival Time: 1509   CC: CHEST tightness  SUBJECTIVE:  Ann York is a 54 y.o. female who presents with complaint of neck and chest tightness x 2 weeks.  Admits to recent stress.  Localizes chest tightness to the substernal region.  Describes as improving, that is intermittent (with episodes last hours) and "hours, and hours, and hours" in character.  Has tried xanax with relief.  Denies aggravating factors.  Reports radiating symptoms.  Reports previous symptoms in the past, unsure of cause.  Was treated by PCP for acid reflux, but has been taking medication without relief.  Complains of intermittent palpitations, tachycardiac, dysuria, and anxiety.  Denies fever, chills, lightheadedness, dizziness, SOB, nausea, vomiting, abdominal pain, changes in bowel habits, diaphoresis, numbness/tingling in extremities, peripheral edema  Denies SOB, calf pain or swelling, recent long travel, recent surgery, pregnancy, malignancy, tobacco use, hormone use, or previous blood clot  Father and grandfather with MI; brother with hx of blood clots  ROS: As per HPI.  All other pertinent ROS negative.    Past Medical History:  Diagnosis Date   Anxiety    GERD (gastroesophageal reflux disease)    Hyperlipidemia    Panic attacks    Thyroid disease    Past Surgical History:  Procedure Laterality Date   BREAST SURGERY     biopsy left breast   CESAREAN SECTION     x 2   ENDOMETRIAL ABLATION     TUBAL LIGATION     Allergies  Allergen Reactions   Septra [Sulfamethoxazole-Trimethoprim] Anaphylaxis   Sulfa Antibiotics Anaphylaxis   No current facility-administered medications on file prior to encounter.   Current Outpatient Medications on File Prior to Encounter  Medication Sig Dispense Refill   famotidine (PEPCID) 40 MG tablet Take 40 mg by mouth daily.     pantoprazole (PROTONIX) 40 MG tablet Take 40 mg by mouth daily.     thyroid  (ARMOUR) 30 MG tablet Take 30 mg by mouth daily before breakfast.     acetaminophen (TYLENOL) 500 MG tablet Take 500 mg by mouth every 6 (six) hours as needed for mild pain or moderate pain.     Menthol-Methyl Salicylate (MUSCLE RUB) 10-15 % CREA Apply 1 application topically as needed for muscle pain.     VITAMIN D PO Take 1 capsule by mouth daily.     [DISCONTINUED] esomeprazole (NEXIUM) 40 MG capsule Take 40 mg by mouth daily before breakfast.       [DISCONTINUED] sertraline (ZOLOFT) 50 MG tablet Take 50 mg by mouth daily.       Social History   Socioeconomic History   Marital status: Divorced    Spouse name: Not on file   Number of children: Not on file   Years of education: Not on file   Highest education level: Not on file  Occupational History   Not on file  Tobacco Use   Smoking status: Former Smoker    Types: Cigarettes   Smokeless tobacco: Never Used  Scientific laboratory technician Use: Never used  Substance and Sexual Activity   Alcohol use: No   Drug use: No   Sexual activity: Not Currently    Birth control/protection: Surgical    Comment: tubal and ablation  Other Topics Concern   Not on file  Social History Narrative   Not on file   Social Determinants of Health   Financial Resource Strain:    Difficulty of Paying Living Expenses:  Not on file  Food Insecurity:    Worried About Charity fundraiser in the Last Year: Not on file   Ran Out of Food in the Last Year: Not on file  Transportation Needs:    Lack of Transportation (Medical): Not on file   Lack of Transportation (Non-Medical): Not on file  Physical Activity:    Days of Exercise per Week: Not on file   Minutes of Exercise per Session: Not on file  Stress:    Feeling of Stress : Not on file  Social Connections:    Frequency of Communication with Friends and Family: Not on file   Frequency of Social Gatherings with Friends and Family: Not on file   Attends Religious Services: Not  on file   Active Member of Clubs or Organizations: Not on file   Attends Archivist Meetings: Not on file   Marital Status: Not on file  Intimate Partner Violence:    Fear of Current or Ex-Partner: Not on file   Emotionally Abused: Not on file   Physically Abused: Not on file   Sexually Abused: Not on file   Family History  Problem Relation Age of Onset   Rheum arthritis Mother    Hypertension Mother    Thyroid disease Mother    Stroke Mother    Hypertension Father    Heart disease Father    Hypertension Sister    Thyroid disease Sister    Hypertension Brother      OBJECTIVE:  Vitals:   01/22/20 1522 01/22/20 1543  BP:  (!) 144/88  Pulse:  88  Resp:  17  Temp:  98.3 F (36.8 C)  TempSrc:  Oral  SpO2:  94%  Weight: 160 lb (72.6 kg)   Height: 5\' 1"  (1.549 m)     General appearance: alert; no distress Eyes: PERRLA; EOMI; conjunctiva normal HENT: normocephalic; atraumatic; PERRL, EOMI grossly; oropharynx clear Neck: supple; carotid bruits Lungs: clear to auscultation bilaterally without adventitious breath sounds Heart: regular rate and rhythm.  Clear S1 and S2 without rubs, gallops, or murmur. Abdomen: soft, non-tender; bowel sounds normal; no guarding Extremities: no cyanosis or edema; symmetrical with no gross deformities Skin: warm and dry Psychological: alert and cooperative; anxious mood and affect  ECG: Declines  ASSESSMENT & PLAN:  1. Chest pain, unspecified type   2. Chest tightness   3. Throat tightness    Recommending further evaluation and management in the ED.  Cannot rule out cardiac cause or blood clot in urgent care setting.  Declines EKG.  Will travel by private vehicle to ED.  Declines EMS transport.     Lestine Box, PA-C 01/22/20 1552

## 2020-01-25 NOTE — ED Provider Notes (Signed)
[facility] EMERGENCY DEPARTMENT Provider Note   CSN: [id number] Arrival date & time: 01/22/20  1557     History Chief Complaint  Patient presents with  . Chest Pain    Ann York is a 54 y.o. female with a history of hyperlipidemia, thyroid disease, GERD  presenting for evaluation of midsternal chest pressure which radiates into throat region and can be triggered by high stress situations, also reports chest wall soreness to palpation since she started working out last month concentrating on upper arm strengthening exercises.  She was at work this morning (works at [employer]) when this symptom slowly developed around 8 am and last for at least 4 hours, then flared up again as she was leaving work.  It is not accompanied by sob, palpitations, n/v, diaphoresis, dizziness, back pain or headache. Also denies leg pain or swelling, denies cough, orthopnea. Reports her GERD has not been well controlled but denies reflux currently.  Her symptoms are currently resolved, spontaneously, she has had no tx prior to arrival here but states in the past xanax has improved her sx.  She has no smoking hx, no family hx of early cardiac illness.  Pt's PMH indicates personal hx of CAD, pt not aware of this dx.   She was seen for similar sx last month at [facility] at which time her work up was negative.  She was referred to cardiology but her appt is not until December, also referred to GI (sees next week) at pt request.    The history is provided by the patient.      Past Medical History:  Diagnosis Date  . Anxiety   . CAD (coronary artery disease)   . Chest pain   . GERD (gastroesophageal reflux disease)   . Hyperlipidemia   . Panic attacks   . Thyroid disease     Patient Active Problem List   Diagnosis Date Noted  . GERD 02/01/2010  . DYSPEPSIA 02/01/2010  . HAND PAIN 06/23/2008    Past Surgical History:  Procedure Laterality Date  . BREAST SURGERY     biopsy left breast  . CESAREAN  SECTION     x 2  . ENDOMETRIAL ABLATION    . TUBAL LIGATION       OB History    Gravida  4   Para  3   Term  3   Preterm      AB  1   Living  3     SAB  1   TAB      Ectopic      Multiple      Live Births              Family History  Problem Relation Age of Onset  . Rheum arthritis Mother   . Hypertension Mother   . Thyroid disease Mother   . Stroke Mother   . Hypertension Father   . Heart disease Father   . Hypertension Sister   . Thyroid disease Sister   . Hypertension Brother     Social History   Tobacco Use  . Smoking status: Former Smoker    Types: Cigarettes  . Smokeless tobacco: Never Used  Vaping Use  . Vaping Use: Never used  Substance Use Topics  . Alcohol use: No  . Drug use: No    Home Medications Prior to Admission medications   Medication Sig Start Date End Date Taking? Authorizing Provider  acetaminophen (TYLENOL) 500 MG tablet Take 500  mg by mouth every 6 (six) hours as needed for mild pain or moderate pain.    [provider]  famotidine (PEPCID) 40 MG tablet Take 40 mg by mouth daily.    [provider]  Menthol-Methyl Salicylate (MUSCLE RUB) 10-15 % CREA Apply 1 application topically as needed for muscle pain.    [provider]  pantoprazole (PROTONIX) 40 MG tablet Take 40 mg by mouth daily.    [provider]  thyroid (ARMOUR) 30 MG tablet Take 30 mg by mouth daily before breakfast.    [provider]  VITAMIN D PO Take 1 capsule by mouth daily.    [provider]  esomeprazole (NEXIUM) 40 MG capsule Take 40 mg by mouth daily before breakfast.    07/01/11  [provider]  sertraline (ZOLOFT) 50 MG tablet Take 50 mg by mouth daily.    07/01/11  [provider]    Allergies    Septra [sulfamethoxazole-trimethoprim] and Sulfa antibiotics  Review of Systems   Review of Systems  Constitutional: Negative for fever.  HENT: Negative for congestion.     Eyes: Negative.   Respiratory: Positive for chest tightness. Negative for shortness of breath and wheezing.   Cardiovascular: Negative for chest pain, palpitations and leg swelling.  Gastrointestinal: Negative for abdominal pain, nausea and vomiting.  Genitourinary: Negative.   Musculoskeletal: Negative for arthralgias, joint swelling, neck pain and neck stiffness.  Skin: Negative.  Negative for rash and wound.  Neurological: Negative for dizziness, weakness, light-headedness, numbness and headaches.  Psychiatric/Behavioral: Negative.   All other systems reviewed and are negative.   Physical Exam Updated Vital Signs BP (!) 145/96 (BP Location: Right Arm)   Pulse 64   Temp 97.7 F (36.5 C) (Oral)   Resp 18   Ht 5\' 1"  (1.549 m)   Wt 73 kg   SpO2 99%   BMI 30.42 kg/m   Physical Exam Vitals and nursing note reviewed.  Constitutional:      Appearance: She is well-developed.  HENT:     Head: Normocephalic and atraumatic.  Eyes:     Conjunctiva/sclera: Conjunctivae normal.  Cardiovascular:     Rate and Rhythm: Normal rate and regular rhythm.     Heart sounds: Normal heart sounds.  Pulmonary:     Effort: Pulmonary effort is normal.     Breath sounds: Normal breath sounds. No wheezing, rhonchi or rales.  Chest:     Chest wall: Tenderness present.  Abdominal:     General: Bowel sounds are normal.     Palpations: Abdomen is soft.     Tenderness: There is no abdominal tenderness.  Musculoskeletal:        General: No tenderness. Normal range of motion.     Cervical back: Normal range of motion.     Right lower leg: No edema.     Left lower leg: No edema.  Skin:    General: Skin is warm and dry.  Neurological:     Mental Status: She is alert.     ED Results / Procedures / Treatments   Labs (all labs ordered are listed, but only abnormal results are displayed) Labs Reviewed  BASIC METABOLIC PANEL  CBC  TROPONIN I (HIGH SENSITIVITY)    EKG EKG  Interpretation  Date/Time:  Saturday January 22 2020 16:17:00 EDT Ventricular Rate:  75 PR Interval:  160 QRS Duration: 66 QT Interval:  360 QTC Calculation: 402 R Axis:   -13 Text Interpretation: Normal sinus rhythm  Cannot rule out Anterior infarct , age undetermined Abnormal ECG No STEMI Confirmed by Octaviano Glow [phone]) on 01/22/2020 4:35:11 PM   Radiology No results found.  Procedures Procedures (including critical care time)  Medications Ordered in ED Medications - No data to display  ED Course  I have reviewed the triage vital signs and the nursing notes.  Pertinent labs & imaging results that were available during my care of the patient were reviewed by me and considered in my medical decision making (see chart for details).    MDM Rules/Calculators/A&P HEAR Score: 3                        Pt with atypical episodes of chest pain, triggered with anxiety, has improved with xanax. Labs including troponin negative.  Sx began 7+ hours prior to arrival, delta trop not indicated.  cxr and ekg normal. Atypical sx for ACS, suspect anxiety vs GERD, however pt will benefit from close outpt cards f/u.  She was advised to contact [place] for f/u care.  Pt is not sob, no pleuritic character to sx, normal VS here, no exam findings of dvt, doubt PE.  Her exam is significant for chest wall tenderness which she has had since last month when she started working out - suggested anti inflammatory/heat tx in the interim.   Discussed with Dr. Langston Masker prior to dc home. Final Clinical Impression(s) / ED Diagnoses Final diagnoses:  Atypical chest pain    Rx / DC Orders ED Discharge Orders    None       Landis Martins 01/25/20 1802    Wyvonnia Dusky, MD 01/27/20 [phone]

## 2020-01-26 ENCOUNTER — Other Ambulatory Visit: Payer: Self-pay

## 2020-01-26 ENCOUNTER — Ambulatory Visit (HOSPITAL_COMMUNITY): Payer: BC Managed Care – PPO

## 2020-01-26 ENCOUNTER — Ambulatory Visit: Payer: BC Managed Care – PPO | Admitting: Interventional Cardiology

## 2020-01-26 ENCOUNTER — Encounter: Payer: Self-pay | Admitting: Interventional Cardiology

## 2020-01-26 VITALS — BP 140/86 | HR 75 | Ht 61.0 in | Wt 164.0 lb

## 2020-01-26 DIAGNOSIS — R072 Precordial pain: Secondary | ICD-10-CM | POA: Diagnosis not present

## 2020-01-26 DIAGNOSIS — Z8249 Family history of ischemic heart disease and other diseases of the circulatory system: Secondary | ICD-10-CM

## 2020-01-26 MED ORDER — METOPROLOL TARTRATE 100 MG PO TABS: ORAL_TABLET | ORAL | 0 refills | Status: DC

## 2020-01-26 NOTE — Patient Instructions (Addendum)
Medication Instructions:  Your physician recommends that you continue on your current medications as directed. Please refer to the Current Medication list given to you today.  *If you need a refill on your cardiac medications before your next appointment, please call your pharmacy*   Lab Work: None  If you have labs (blood work) drawn today and your tests are completely normal, you will receive your results only by: Marland Kitchen MyChart Message (if you have MyChart) OR . A paper copy in the mail If you have any lab test that is abnormal or we need to change your treatment, we will call you to review the results.   Testing/Procedures: Your physician has requested that you have cardiac CT.  Follow-Up: Based on test results   Other Instructions Your cardiac CT will be scheduled at one of the below locations:   [facility] [address] [place], [place] [address] [phone]  [place] [address] [place], [place] [address] [phone]  If scheduled at [facility], please arrive at the [facility] main entrance of [facility] 30 minutes prior to test start time. Proceed to the [facility] Radiology Department (first floor) to check-in and test prep.  If scheduled at [facility], please arrive 15 mins early for check-in and test prep.  Please follow these instructions carefully (unless otherwise directed):   On the Night Before the Test: . Be sure to Drink plenty of water. . Do not consume any caffeinated/decaffeinated beverages or chocolate 12 hours prior to your test. . Do not take any antihistamines 12 hours prior to your test.   On the Day of the Test: . Drink plenty of water. Do not drink any water within one hour of the test. . Do not eat any food 4 hours prior to the test. . You may take your regular medications prior to the test.  . Take metoprolol (Lopressor)  100 MG two hours prior to test. . FEMALES- please wear underwire-free bra if available       After the Test: . Drink plenty of water. . After receiving IV contrast, you may experience a mild flushed feeling. This is normal. . On occasion, you may experience a mild rash up to 24 hours after the test. This is not dangerous. If this occurs, you can take Benadryl 25 mg and increase your fluid intake. . If you experience trouble breathing, this can be serious. If it is severe call 911 IMMEDIATELY. If it is mild, please call our office.  Once we have confirmed authorization from your insurance company, we will call you to set up a date and time for your test. Based on how quickly your insurance processes prior authorizations requests, please allow up to 4 weeks to be contacted for scheduling your Cardiac CT appointment. Be advised that routine Cardiac CT appointments could be scheduled as many as 8 weeks after your provider has ordered it.  For non-scheduling related questions, please contact the cardiac imaging nurse navigator should you have any questions/concerns: Marchia Bond, Cardiac Imaging Nurse Navigator Burley Saver, Interim Cardiac Imaging Nurse [facility] Direct Office Dial: [phone]   For scheduling needs, including cancellations and rescheduling, please call Vivien Rota at [phone], option 3.

## 2020-01-26 NOTE — Progress Notes (Signed)
Cardiology Office Note   Date:  01/26/2020   ID:  SHAANA ACOCELLA, DOB 26-Sep-1965, MRN [id number]  PCP:  Jake Samples, PA-C    No chief complaint on file.  Chest pain  Wt Readings from Last 3 Encounters:  01/26/20 164 lb (74.4 kg)  01/22/20 161 lb (73 kg)  01/22/20 160 lb (72.6 kg)       History of Present Illness: Ann York is a 54 y.o. female who is being seen today for the evaluation of chest pain at the request of Wandra Scot Pru*.  Records form ER visit show: "with a history of hyperlipidemia, thyroid disease, GERD  presenting for evaluation of midsternal chest pressure which radiates into throat region and can be triggered by high stress situations, also reports chest wall soreness to palpation since she started working out last month concentrating on upper arm strengthening exercises.  She was at work this morning (works at [employer]) when this symptom slowly developed around 8 am and last for at least 4 hours, then flared up again as she was leaving work.  It is not accompanied by sob, palpitations, n/v, diaphoresis, dizziness, back pain or headache. Also denies leg pain or swelling, denies cough, orthopnea. Reports her GERD has not been well controlled but denies reflux currently.  Her symptoms are currently resolved, spontaneously, she has had no tx prior to arrival here but states in the past xanax has improved her sx.  She has no smoking hx, no family hx of early cardiac illness.  Pt's PMH indicates personal hx of CAD, pt not aware of this dx.   She was seen for similar sx last month at [facility] at which time her work up was negative.  She was referred to cardiology but her appt is not until December, also referred to GI (sees next week) at pt request. "  She is anxious and started taking meds for the anxiety.  Feels like a tightness followed by pain in the cener ogf her chest.  Emotion stress also makes it worse.  Father had MI in his 66s.    No  regular exercise. No smoking.   She has some GERD sx as well with certain foods.    Past Medical History:  Diagnosis Date   Anxiety    CAD (coronary artery disease)    Chest pain    GERD (gastroesophageal reflux disease)    Hyperlipidemia    Panic attacks    Thyroid disease     Past Surgical History:  Procedure Laterality Date   BREAST SURGERY     biopsy left breast   CESAREAN SECTION     x 2   ENDOMETRIAL ABLATION     TUBAL LIGATION       Current Outpatient Medications  Medication Sig Dispense Refill   acetaminophen (TYLENOL) 500 MG tablet Take 500 mg by mouth every 6 (six) hours as needed for mild pain or moderate pain.     ALPRAZolam (XANAX) 0.5 MG tablet Take 0.5 mg by mouth at bedtime as needed for anxiety.     escitalopram (LEXAPRO) 5 MG tablet Take 5 mg by mouth daily.     famotidine (PEPCID) 40 MG tablet Take 40 mg by mouth daily.     Menthol-Methyl Salicylate (MUSCLE RUB) 10-15 % CREA Apply 1 application topically as needed for muscle pain.     pantoprazole (PROTONIX) 40 MG tablet Take 40 mg by mouth daily.     rosuvastatin (CRESTOR) 10  MG tablet Take 10 mg by mouth at bedtime.     thyroid (ARMOUR) 30 MG tablet Take 30 mg by mouth daily before breakfast.     VITAMIN D PO Take 1 capsule by mouth daily.     No current facility-administered medications for this visit.    Allergies:   Septra [sulfamethoxazole-trimethoprim] and Sulfa antibiotics    Social History:  The patient  reports that she has quit smoking. Her smoking use included cigarettes. She has never used smokeless tobacco. She reports that she does not drink alcohol and does not use drugs.   Family History:  The patient's family history includes Heart disease in her father; Hypertension in her brother, father, mother, and sister; Rheum arthritis in her mother; Stroke in her mother; Thyroid disease in her mother and sister.    ROS:  Please see the history of present illness.    Otherwise, review of systems are positive for anxiety.   All other systems are reviewed and negative.    PHYSICAL EXAM: VS:  BP 140/86    Pulse 75    Ht 5\' 1"  (1.549 m)    Wt 164 lb (74.4 kg)    SpO2 96%    BMI 30.99 kg/m  , BMI Body mass index is 30.99 kg/m. GEN: Well nourished, well developed, in no acute distress  HEENT: normal  Neck: no JVD, carotid bruits, or masses Cardiac: RRR; no murmurs, rubs, or gallops,no edema  Respiratory:  clear to auscultation bilaterally, normal work of breathing GI: soft, nontender, nondistended, + BS MS: no deformity or atrophy  Skin: warm and dry, no rash Neuro:  Strength and sensation are intact Psych: euthymic mood, full affect   EKG:   The ekg ordered 10/16 demonstrates NSR, no ST changes   Recent Labs: 01/22/2020: BUN 16; Creatinine, Ser 0.54; Hemoglobin 14.5; Platelets 287; Potassium 3.7; Sodium 140   Lipid Panel No results found for: CHOL, TRIG, HDL, CHOLHDL, VLDL, LDLCALC, LDLDIRECT   Other studies Reviewed: Additional studies/ records that were reviewed today with results demonstrating: troponin negative a few days ago.   ASSESSMENT AND PLAN:  1. Precordial chest pain: SOme typical and some atypical features.  Need to rule out cardiac etiology given her family history.  Plan for coronary CTA. Metoprolol 100 mg x 1 before scan.   2. GERD:  Seeing GI coming up 3. Needs screening lipids when fasting.  Normal Cr.    Current medicines are reviewed at length with the patient today.  The patient concerns regarding her medicines were addressed.  The following changes have been made:  No change  Labs/ tests ordered today include:  No orders of the defined types were placed in this encounter.   Recommend 150 minutes/week of aerobic exercise Low fat, low carb, high fiber diet recommended  Disposition:   FU based on CT scan result   Signed, Larae Grooms, MD  01/26/2020 4:04 PM    [facility] [place], [place], [place]  [address] Phone: [phone]; Fax: [phone]

## 2020-01-27 ENCOUNTER — Ambulatory Visit (HOSPITAL_COMMUNITY)
Admission: RE | Admit: 2020-01-27 | Discharge: 2020-01-27 | Disposition: A | Discharge status: Home / Self Care | Payer: BC Managed Care – PPO | Source: Ambulatory Visit | Attending: Family Medicine | Admitting: Family Medicine

## 2020-01-27 DIAGNOSIS — Z1231 Encounter for screening mammogram for malignant neoplasm of breast: Secondary | ICD-10-CM | POA: Insufficient documentation

## 2020-02-09 ENCOUNTER — Ambulatory Visit: Payer: BC Managed Care – PPO | Admitting: Internal Medicine

## 2020-02-10 ENCOUNTER — Ambulatory Visit: Payer: BC Managed Care – PPO | Admitting: Internal Medicine

## 2020-03-07 ENCOUNTER — Telehealth (HOSPITAL_COMMUNITY): Payer: Self-pay | Admitting: Emergency Medicine

## 2020-03-07 NOTE — Telephone Encounter (Signed)
Reaching out to patient to offer assistance regarding upcoming cardiac imaging study; pt verbalizes understanding of appt date/time, parking situation and where to check in, pre-test NPO status and medications ordered, and verified current allergies; name and call back number provided for further questions should they arise Marchia Bond RN Navigator Cardiac Imaging [facility] [phone] office [phone] cell  Taking 100mg  metoprolol tartrate + PRN xanax for anxiety prior to scan  Gaetan Spieker

## 2020-03-08 ENCOUNTER — Encounter: Payer: BC Managed Care – PPO | Admitting: *Deleted

## 2020-03-08 ENCOUNTER — Other Ambulatory Visit: Payer: Self-pay

## 2020-03-08 ENCOUNTER — Encounter (HOSPITAL_COMMUNITY): Payer: Self-pay

## 2020-03-08 ENCOUNTER — Ambulatory Visit (HOSPITAL_COMMUNITY)
Admission: RE | Admit: 2020-03-08 | Discharge: 2020-03-08 | Disposition: A | Discharge status: Home / Self Care | Payer: BC Managed Care – PPO | Source: Ambulatory Visit | Attending: Interventional Cardiology | Admitting: Interventional Cardiology

## 2020-03-08 DIAGNOSIS — R072 Precordial pain: Secondary | ICD-10-CM | POA: Insufficient documentation

## 2020-03-08 DIAGNOSIS — Z006 Encounter for examination for normal comparison and control in clinical research program: Secondary | ICD-10-CM

## 2020-03-08 MED ORDER — NITROGLYCERIN 0.4 MG SL SUBL
0.8000 mg | SUBLINGUAL_TABLET | Freq: Once | SUBLINGUAL | Status: AC
  Administered 2020-03-08: 0.8 mg via SUBLINGUAL

## 2020-03-08 MED ORDER — IOHEXOL 350 MG/ML SOLN
80.0000 mL | Freq: Once | INTRAVENOUS | Status: AC | PRN
  Administered 2020-03-08: 80 mL via INTRAVENOUS

## 2020-03-08 MED ORDER — NITROGLYCERIN 0.4 MG SL SUBL
SUBLINGUAL_TABLET | SUBLINGUAL | Status: AC
  Filled 2020-03-08: qty 2

## 2020-03-08 NOTE — Research (Signed)
Subject Name: Ann York  Subject met inclusion and exclusion criteria.  The informed consent form, study requirements and expectations were reviewed with the subject and questions and concerns were addressed prior to the signing of the consent form.  The subject verbalized understanding of the trial requirements.  The subject agreed to participate in the IDENTIFY trial and signed the informed consent at 0909 on 03/08/20  The informed consent was obtained prior to performance of any protocol-specific procedures for the subject.  A copy of the signed informed consent was given to the subject and a copy was placed in the subject's medical record.   [name]

## 2020-03-08 NOTE — Discharge Instructions (Signed)
Cardiac CT Angiogram A cardiac CT angiogram is a procedure to look at the heart and the area around the heart. It may be done to help find the cause of chest pains or other symptoms of heart disease. During this procedure, a substance called contrast dye is injected into the blood vessels in the area to be checked. A large X-ray machine, called a CT scanner, then takes detailed pictures of the heart and the surrounding area. The procedure is also sometimes called a coronary CT angiogram, coronary artery scanning, or CTA. A cardiac CT angiogram allows the health care provider to see how well blood is flowing to and from the heart. The health care provider will be able to see if there are any problems, such as:  Blockage or narrowing of the coronary arteries in the heart.  Fluid around the heart.  Signs of weakness or disease in the muscles, valves, and tissues of the heart. Tell a health care provider about:  Any allergies you have. This is especially important if you have had a previous allergic reaction to contrast dye.  All medicines you are taking, including vitamins, herbs, eye drops, creams, and over-the-counter medicines.  Any blood disorders you have.  Any surgeries you have had.  Any medical conditions you have.  Whether you are pregnant or may be pregnant.  Any anxiety disorders, chronic pain, or other conditions you have that may increase your stress or prevent you from lying still. What are the risks? Generally, this is a safe procedure. However, problems may occur, including:  Bleeding.  Infection.  Allergic reactions to medicines or dyes.  Damage to other structures or organs.  Kidney damage from the contrast dye that is used.  Increased risk of cancer from radiation exposure. This risk is low. Talk with your health care provider about: ? The risks and benefits of testing. ? How you can receive the lowest dose of radiation. What happens before the  procedure?  Wear comfortable clothing and remove any jewelry, glasses, dentures, and hearing aids.  Follow instructions from your health care provider about eating and drinking. This may include: ? For 12 hours before the procedure -- avoid caffeine. This includes tea, coffee, soda, energy drinks, and diet pills. Drink plenty of water or other fluids that do not have caffeine in them. Being well hydrated can prevent complications. ? For 4-6 hours before the procedure -- stop eating and drinking. The contrast dye can cause nausea, but this is less likely if your stomach is empty.  Ask your health care provider about changing or stopping your regular medicines. This is especially important if you are taking diabetes medicines, blood thinners, or medicines to treat problems with erections (erectile dysfunction). What happens during the procedure?   Hair on your chest may need to be removed so that small sticky patches called electrodes can be placed on your chest. These will transmit information that helps to monitor your heart during the procedure.  An IV will be inserted into one of your veins.  You might be given a medicine to control your heart rate during the procedure. This will help to ensure that good images are obtained.  You will be asked to lie on an exam table. This table will slide in and out of the CT machine during the procedure.  Contrast dye will be injected into the IV. You might feel warm, or you may get a metallic taste in your mouth.  You will be given a medicine called   nitroglycerin. This will relax or dilate the arteries in your heart.  The table that you are lying on will move into the CT machine tunnel for the scan.  The person running the machine will give you instructions while the scans are being done. You may be asked to: ? Keep your arms above your head. ? Hold your breath. ? Stay very still, even if the table is moving.  When the scanning is complete, you  will be moved out of the machine.  The IV will be removed. The procedure may vary among health care providers and hospitals. What can I expect after the procedure? After your procedure, it is common to have:  A metallic taste in your mouth from the contrast dye.  A feeling of warmth.  A headache from the nitroglycerin. Follow these instructions at home:  Take over-the-counter and prescription medicines only as told by your health care provider.  If you are told, drink enough fluid to keep your urine pale yellow. This will help to flush the contrast dye out of your body.  Most people can return to their normal activities right after the procedure. Ask your health care provider what activities are safe for you.  It is up to you to get the results of your procedure. Ask your health care provider, or the department that is doing the procedure, when your results will be ready.  Keep all follow-up visits as told by your health care provider. This is important. Contact a health care provider if:  You have any symptoms of allergy to the contrast dye. These include: ? Shortness of breath. ? Rash or hives. ? A racing heartbeat. Summary  A cardiac CT angiogram is a procedure to look at the heart and the area around the heart. It may be done to help find the cause of chest pains or other symptoms of heart disease.  During this procedure, a large X-ray machine, called a CT scanner, takes detailed pictures of the heart and the surrounding area after a contrast dye has been injected into blood vessels in the area.  Ask your health care provider about changing or stopping your regular medicines before the procedure. This is especially important if you are taking diabetes medicines, blood thinners, or medicines to treat erectile dysfunction.  If you are told, drink enough fluid to keep your urine pale yellow. This will help to flush the contrast dye out of your body. This information is not  intended to replace advice given to you by your health care provider. Make sure you discuss any questions you have with your health care provider. Document Revised: 11/18/2018 Document Reviewed: 11/18/2018 Elsevier Patient Education  2020 [employer] Testing With IV Contrast Material IV contrast material is a fluid that is used with some imaging tests. It is injected into your body through a vein. Contrast material is used when your health care providers need a detailed look at organs, tissues, or blood vessels that may not show up with the standard test. The material may be used when an X-ray, an MRI, a CT scan, or an ultrasound is done. IV contrast material may be used for imaging tests that check:  Muscles, skin, and fat.  Breasts.  Brain.  Digestive tract.  Heart.  Organs such as the liver, kidneys, lungs, bladder, and many others.  Arteries and veins. Tell a health care provider about:  Any allergies you have, especially an allergy to contrast material.  All medicines you are taking, including   metformin, beta blockers, NSAIDs (such as ibuprofen), interleukin-2, vitamins, herbs, eye drops, creams, and over-the-counter medicines.  Any problems you or family members have had with the use of contrast material.  Any blood disorders you have, such as sickle cell anemia.  Any surgeries you have had.  Any medical conditions you have or have had, especially alcohol abuse, dehydration, asthma, or kidney, liver, or heart problems.  Whether you are pregnant or may be pregnant.  Whether you are breastfeeding. Most contrast materials are safe for use in breastfeeding women. What are the risks? Generally, this is a safe procedure. However, problems may occur, including:  Headache.  Itching, skin rash, and hives.  Nausea and vomiting.  Allergic reactions.  Wheezing or difficulty breathing.  Abnormal heart rate.  Changes in blood pressure.  Throat swelling.  Kidney  damage. What happens before the procedure? Medicines Ask your health care provider about:  Changing or stopping your regular medicines. This is especially important if you are taking diabetes medicines or blood thinners.  Taking medicines such as aspirin and ibuprofen. These medicines can thin your blood. Do not take these medicines unless your health care provider tells you to take them.  Taking over-the-counter medicines, vitamins, herbs, and supplements. If you are at risk of having a reaction to the IV contrast material, you may be asked to take medicine before the procedure to prevent a reaction. General instructions  Follow instructions from your health care provider about eating or drinking restrictions.  You may have an exam or lab tests to make sure that you can safely get IV contrast material.  Ask if you will be given a medicine to help you relax (sedative) during the procedure. If so, plan to have someone take you home from the hospital or clinic. What happens during the procedure?  You may be given a sedative to help you relax.  An IV will be inserted into one of your veins.  Contrast material will be injected into your IV.  You may feel warmth or flushing as the contrast material enters your bloodstream.  You may have a metallic taste in your mouth for a few minutes.  The needle may cause some discomfort and bruising.  After the contrast material is in your body, the imaging test will be done. The procedure may vary among health care providers and hospitals. What can I expect after the procedure?  The IV will be removed.  You may be taken to a recovery area if sedation medicines were used. Your blood pressure, heart rate, breathing rate, and blood oxygen level will be monitored until you leave the hospital or clinic. Follow these instructions at home:   Take over-the-counter and prescription medicines only as told by your health care provider. ? Your health  care provider may tell you to not take certain medicines for a couple of days after the procedure. This is especially important if you are taking diabetes medicines.  If you are told, drink enough fluid to keep your urine pale yellow. This will help to remove the contrast material out of your body.  Do not drive for 24 hours if you were given a sedative during your procedure.  It is up to you to get the results of your procedure. Ask your health care provider, or the department that is doing the procedure, when your results will be ready.  Keep all follow-up visits as told by your health care provider. This is important. Contact a health care provider if:    You have redness, swelling, or pain near your IV site. Get help right away if:  You have an abnormal heart rhythm.  You have trouble breathing.  You have: ? Chest pain. ? Pain in your back, neck, arm, jaw, or stomach. ? Nausea or sweating. ? Hives or a rash.  You start shaking and cannot stop. These symptoms may represent a serious problem that is an emergency. Do not wait to see if the symptoms will go away. Get medical help right away. Call your local emergency services (911 in the [place] Do not drive yourself to the hospital. Summary  IV contrast material may be used for imaging tests to help your health care providers see your organs and tissues more clearly.  Tell your health care provider if you are pregnant or may be pregnant.  During the procedure, you may feel warmth or flushing as the contrast material enters your bloodstream.  After the procedure, drink enough fluid to keep your urine pale yellow. This information is not intended to replace advice given to you by your health care provider. Make sure you discuss any questions you have with your health care provider. Document Revised: 06/11/2018 Document Reviewed: 06/11/2018 Elsevier Patient Education  2020 [employer] Inc.  

## 2020-03-22 ENCOUNTER — Ambulatory Visit: Payer: BC Managed Care – PPO | Admitting: Internal Medicine

## 2020-04-07 ENCOUNTER — Other Ambulatory Visit: Payer: Self-pay

## 2020-04-07 ENCOUNTER — Ambulatory Visit
Admission: EM | Admit: 2020-04-07 | Discharge: 2020-04-07 | Disposition: A | Discharge status: Home / Self Care | Payer: BC Managed Care – PPO | Attending: Family Medicine | Admitting: Family Medicine

## 2020-04-07 ENCOUNTER — Encounter: Payer: Self-pay | Admitting: Family Medicine

## 2020-04-07 DIAGNOSIS — J0110 Acute frontal sinusitis, unspecified: Secondary | ICD-10-CM

## 2020-04-07 MED ORDER — AMOXICILLIN-POT CLAVULANATE 875-125 MG PO TABS: 1.0000 | ORAL_TABLET | Freq: Two times a day (BID) | ORAL | 0 refills | Status: DC

## 2020-04-07 NOTE — ED Triage Notes (Signed)
t presents with nasal pressure and facial pain for past week, no other stmptoms

## 2020-04-07 NOTE — Discharge Instructions (Addendum)
Take the antibiotics as prescribed. Recommend Mucinex D, saline nasal spray or Flonase. Follow up as needed for continued or worsening symptoms

## 2020-04-14 ENCOUNTER — Ambulatory Visit: Payer: BC Managed Care – PPO | Admitting: Cardiology

## 2020-06-15 ENCOUNTER — Ambulatory Visit: Payer: BC Managed Care – PPO | Admitting: Orthopedic Surgery

## 2020-06-15 ENCOUNTER — Encounter: Payer: Self-pay | Admitting: Orthopedic Surgery

## 2020-06-15 ENCOUNTER — Ambulatory Visit: Payer: BC Managed Care – PPO

## 2020-06-15 ENCOUNTER — Other Ambulatory Visit: Payer: Self-pay

## 2020-06-15 VITALS — BP 131/89 | HR 87 | Ht 61.0 in | Wt 172.0 lb

## 2020-06-15 DIAGNOSIS — F419 Anxiety disorder, unspecified: Secondary | ICD-10-CM | POA: Insufficient documentation

## 2020-06-15 DIAGNOSIS — M5410 Radiculopathy, site unspecified: Secondary | ICD-10-CM

## 2020-06-15 DIAGNOSIS — M545 Low back pain, unspecified: Secondary | ICD-10-CM | POA: Diagnosis not present

## 2020-06-15 DIAGNOSIS — E559 Vitamin D deficiency, unspecified: Secondary | ICD-10-CM | POA: Insufficient documentation

## 2020-06-15 DIAGNOSIS — F321 Major depressive disorder, single episode, moderate: Secondary | ICD-10-CM | POA: Insufficient documentation

## 2020-06-15 DIAGNOSIS — R748 Abnormal levels of other serum enzymes: Secondary | ICD-10-CM | POA: Insufficient documentation

## 2020-06-15 DIAGNOSIS — E039 Hypothyroidism, unspecified: Secondary | ICD-10-CM | POA: Insufficient documentation

## 2020-06-15 DIAGNOSIS — E785 Hyperlipidemia, unspecified: Secondary | ICD-10-CM | POA: Insufficient documentation

## 2020-06-15 MED ORDER — GABAPENTIN 100 MG PO CAPS: ORAL_CAPSULE | ORAL | 2 refills | Status: DC

## 2020-06-15 NOTE — Patient Instructions (Signed)
Pinched Nerve A pinched nerve is an injury that occurs when too much pressure is placed on a nerve. This pressure can cause pain, burning, and muscle weakness in places that the nerve supplies feeling to, such as an arm, hand, or leg, or the back or neck. If a nerve is severely pinched or has been pinched for a long time, permanent nerve damage can occur. What are the causes? This condition may be caused by:  A nerve passing through a narrow area between bones or other body structures.  Arthritis that causes bones to press on a nerve.  Loss of blood supply to a nerve.  A nerve being stretched from an injury.  A sudden injury with swelling.  Long-term wear on the nerve.  Age-related changes in the spine. What are the signs or symptoms? The most common symptoms of a pinched nerve are feeling a tingling sensation and numbness. Other symptoms include:  Pain that spreads from one area of the body part to another.  A burning feeling.  Muscle weakness. How is this diagnosed? This condition may be diagnosed based on:  A physical exam. During the exam, your health care provider will: ? Check for numbness and muscle weakness. ? Move affected body parts to test for pain.  X-rays to check for bone damage.  A MRI or CT scan to check for conditions that may be causing nerve damage.  A muscle test (electromyogram, EMG) to evaluate how your muscles and nerves communicate. How is this treated? A pinched nerve is usually treated first by:  Resting the affected body area.  Using devices to help you move without pain (supportive or protective devices), such as a splint, brace, or neck collar. Other treatments depend on your symptoms and the amount of nerve damage you have. Other treatments may include:  Medicines, such as: ? Injections of numbing medicine. ? NSAIDs. ? Pain medicines. ? Steroid medicines. These may be given as a pill or as an injection.  Physical therapy to relieve  pain, maintain movement, and improve muscle strength.  Surgery. This may be done if other treatments do not work.      Follow these instructions at home:  Take over-the-counter and prescription medicines only as told by your health care provider.  Wear supportive or protective devices as told by your health care provider.  Do physical therapy exercises as directed.  Ask your health care provider what activities are safe for you.  Rest as needed.  If directed, put ice on the affected area: ? Put ice in a plastic bag. ? Place a towel between your skin and the bag. ? Leave the ice on for 20 minutes, 2-3 times a day.  If directed, apply heat to the affected area. Use the heat source that your health care provider recommends, such as a moist heat pack or a heating pad. ? Place a towel between your skin and the heat source. ? Leave the heat on for 20-30 minutes. ? Remove the heat if your skin turns bright red. This is especially important if you are unable to feel pain, heat, or cold. You may have a greater risk of getting burned.  Do not drive or use heavy machinery while taking prescription pain medicine.  Keep all follow-up visits as told by your health care provider. This is important.      Contact a health care provider if:  Your condition does not improve with treatment.  Your pain, numbness, or weakness suddenly gets worse.  Get help right away if you:  Have loss of bladder control (urinary incontinence) or you cannot urinate.  Cannot control bowel movements (fecal incontinence).  Have new weakness in your arms or legs. Summary  A pinched nerve is an injury that occurs when too much pressure is placed on a nerve.  This pressure can cause pain, burning, and muscle weakness in places that the nerve supplies feeling to, such as an arm, hand, or leg, or the back or neck.  Take over-the-counter and prescription medicines only as told by your health care provider.  Ask  your health care provider what activities are safe for you while you are having symptoms. This information is not intended to replace advice given to you by your health care provider. Make sure you discuss any questions you have with your health care provider. Document Revised: 04/11/2017 Document Reviewed: 04/08/2017 Elsevier Patient Education  2021 [employer].  Radicular Pain Radicular pain is a type of pain that spreads from your back or neck along a spinal nerve. Spinal nerves are nerves that leave the spinal cord and go to the muscles. Radicular pain is sometimes called radiculopathy, radiculitis, or a pinched nerve. When you have this type of pain, you may also have weakness, numbness, or tingling in the area of your body that is supplied by the nerve. The pain may feel sharp and burning. Depending on which spinal nerve is affected, the pain may occur in the:  Neck area (cervical radicular pain). You may also feel pain, numbness, weakness, or tingling in the arms.  Mid-spine area (thoracic radicular pain). You would feel this pain in the back and chest. This type is rare.  Lower back area (lumbar radicular pain). You would feel this pain as low back pain. You may feel pain, numbness, weakness, or tingling in the buttocks or legs. Sciatica is a type of lumbar radicular pain that shoots down the back of the leg. Radicular pain occurs when one of the spinal nerves becomes irritated or squeezed (compressed). It is often caused by something pushing on a spinal nerve, such as one of the bones of the spine (vertebrae) or one of the round cushions between vertebrae (intervertebral disks). This can result from:  An injury.  Wear and tear or aging of a disk.  The growth of a bone spur that pushes on the nerve. Radicular pain often goes away when you follow instructions from your health care provider for relieving pain at home. Follow these instructions at home: Managing pain  If directed, put  ice on the affected area: ? Put ice in a plastic bag. ? Place a towel between your skin and the bag. ? Leave the ice on for 20 minutes, 2-3 times a day.  If directed, apply heat to the affected area as often as told by your health care provider. Use the heat source that your health care provider recommends, such as a moist heat pack or a heating pad. ? Place a towel between your skin and the heat source. ? Leave the heat on for 20-30 minutes. ? Remove the heat if your skin turns bright red. This is especially important if you are unable to feel pain, heat, or cold. You may have a greater risk of getting burned.      Activity  Do not sit or rest in bed for long periods of time.  Try to stay as active as possible. Ask your health care provider what type of exercise or activity is  best for you.  Avoid activities that make your pain worse, such as bending and lifting.  Do not lift anything that is heavier than 10 lb (4.5 kg), or the limit that you are told, until your health care provider says that it is safe.  Practice using proper technique when lifting items. Proper lifting technique involves bending your knees and rising up.  Do strength and range-of-motion exercises only as told by your health care provider or physical therapist.   General instructions  Take over-the-counter and prescription medicines only as told by your health care provider.  Pay attention to any changes in your symptoms.  Keep all follow-up visits as told by your health care provider. This is important. ? Your health care provider may send you to a physical therapist to help with this pain. Contact a health care provider if:  Your pain and other symptoms get worse.  Your pain medicine is not helping.  Your pain has not improved after a few weeks of home care.  You have a fever. Get help right away if:  You have severe pain, weakness, or numbness.  You have difficulty with bladder or bowel  control. Summary  Radicular pain is a type of pain that spreads from your back or neck along a spinal nerve.  When you have radicular pain, you may also have weakness, numbness, or tingling in the area of your body that is supplied by the nerve.  The pain may feel sharp or burning.  Radicular pain may be treated with ice, heat, medicines, or physical therapy. This information is not intended to replace advice given to you by your health care provider. Make sure you discuss any questions you have with your health care provider. Document Revised: 10/07/2017 Document Reviewed: 10/07/2017 Elsevier Patient Education  2021 [employer].

## 2020-06-15 NOTE — Progress Notes (Signed)
Chief Complaint  Patient presents with  . Leg Pain    Left leg down to knee, Patient reports this is worse in the last few weeks.      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  Review of systems she complains of a mass in the medial aspect of her groin which looks like a lipoma  She has not had any treatment  She also tells [place] that she had chiropractic treatment for her neck they noticed that her hips were not level  Past Medical History:  Diagnosis Date  . Anxiety   . CAD (coronary artery disease)   . Chest pain   . GERD (gastroesophageal reflux disease)   . Hyperlipidemia   . Panic attacks   . Thyroid disease    Past Surgical History:  Procedure Laterality Date  . BREAST SURGERY     biopsy left breast  . CESAREAN SECTION     x 2  . ENDOMETRIAL ABLATION    . TUBAL LIGATION     Social History   Tobacco Use  . Smoking status: Former Smoker    Types: Cigarettes  . Smokeless tobacco: Never Used  Vaping Use  . Vaping Use: Never used  Substance Use Topics  . Alcohol use: No  . 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 gabapentin (NEURONTIN) 100 MG capsule    Sig: 1 in the morning and 3 at night    Dispense:  90 capsule    Refill:  2

## 2020-07-04 ENCOUNTER — Ambulatory Visit (HOSPITAL_COMMUNITY): Payer: BC Managed Care – PPO

## 2020-07-11 ENCOUNTER — Encounter: Payer: Self-pay | Admitting: Women's Health

## 2020-07-11 ENCOUNTER — Ambulatory Visit (INDEPENDENT_AMBULATORY_CARE_PROVIDER_SITE_OTHER): Payer: BC Managed Care – PPO | Admitting: Women's Health

## 2020-07-11 ENCOUNTER — Other Ambulatory Visit (HOSPITAL_COMMUNITY)
Admission: RE | Admit: 2020-07-11 | Discharge: 2020-07-11 | Disposition: A | Discharge status: Home / Self Care | Payer: BC Managed Care – PPO | Source: Ambulatory Visit | Attending: Obstetrics & Gynecology | Admitting: Obstetrics & Gynecology

## 2020-07-11 ENCOUNTER — Other Ambulatory Visit: Payer: Self-pay

## 2020-07-11 VITALS — BP 126/79 | HR 81 | Ht 65.0 in | Wt 173.5 lb

## 2020-07-11 DIAGNOSIS — M545 Low back pain, unspecified: Secondary | ICD-10-CM | POA: Diagnosis not present

## 2020-07-11 DIAGNOSIS — Z8742 Personal history of other diseases of the female genital tract: Secondary | ICD-10-CM

## 2020-07-11 DIAGNOSIS — R82998 Other abnormal findings in urine: Secondary | ICD-10-CM | POA: Diagnosis not present

## 2020-07-11 DIAGNOSIS — Z01419 Encounter for gynecological examination (general) (routine) without abnormal findings: Secondary | ICD-10-CM | POA: Diagnosis present

## 2020-07-11 DIAGNOSIS — L918 Other hypertrophic disorders of the skin: Secondary | ICD-10-CM | POA: Diagnosis not present

## 2020-07-11 LAB — POCT URINALYSIS DIPSTICK
Blood, UA: NEGATIVE
Glucose, UA: NEGATIVE
Ketones, UA: NEGATIVE
Nitrite, UA: NEGATIVE
Protein, UA: NEGATIVE

## 2020-07-11 NOTE — Progress Notes (Signed)
WELL-WOMAN EXAMINATION Patient name: Ann York MRN [id number]  Date of birth: 01-Aug-1965 Chief Complaint:   Gynecologic Exam (Low back pain x 2 weeks)  History of Present Illness:   Ann York is a 55 y.o. [phone] Caucasian female being seen today for a routine well-woman exam.  Current complaints: low back pain, usually has same pain w/ UTI. Skin tag Lt vulva x years, wants it removed today if possible.   Depression screen [facility] 2/9 07/11/2020 11/13/2017  Decreased Interest 0 0  Down, Depressed, Hopeless 0 0  PHQ - 2 Score 0 0  Altered sleeping 2 -  Tired, decreased energy 2 -  Change in appetite 0 -  Feeling bad or failure about yourself  0 -  Trouble concentrating 0 -  Moving slowly or fidgety/restless 0 -  Suicidal thoughts 0 -  PHQ-9 Score 4 -     PCP: Malvin Johns      does not desire labs-will do w/ PCP No LMP recorded. Patient has had an ablation. The current method of family planning is post menopausal status, tubal ligation and endometrial ablation.  Last pap 11/18/17. Results were: LSIL w/ HRHPV negative w/ colpo bx & ECC CIN-I. H/O abnormal pap: yes Last mammogram: 01/27/20. Results were: normal. Family h/o breast cancer: no Last colonoscopy: never. Results were: N/A. 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 Discussed Cologuard, wants to do this Review of Systems:   Pertinent items are noted in HPI Denies any headaches, blurred vision, fatigue, shortness of breath, chest pain, abdominal pain, abnormal vaginal discharge/itching/odor/irritation, problems with periods, bowel movements, urination, or intercourse unless otherwise stated above. Pertinent History Reviewed:  Reviewed past medical,surgical, social and family history.  Reviewed problem list, medications and allergies. Physical Assessment:   Vitals:   07/11/20 1425  BP: 126/79  Pulse: 81  Weight: 173 lb 8 oz (78.7 kg)  Height: 5\' 5"  (1.651 m)  Body  mass index is 28.87 kg/m.        Physical Examination:   General appearance - well appearing, and in no distress  Mental status - alert, oriented to person, place, and time  Psych:  She has a normal mood and affect  Skin - warm and dry, normal color, no suspicious lesions noted  Chest - effort normal, all lung fields clear to auscultation bilaterally  Heart - normal rate and regular rhythm  Neck:  midline trachea, no thyromegaly or nodules  Breasts - breasts appear normal, no suspicious masses, no skin or nipple changes or  axillary nodes  Abdomen - soft, nontender, nondistended, no masses or organomegaly  Pelvic - VULVA: normal appearing vulva with no masses, tenderness or lesions, pedunculated skin tage Lt vulva  VAGINA: normal appearing vagina with normal color and discharge, no lesions  CERVIX: normal appearing cervix without discharge or lesions, no CMT, ~2cm round smooth fleshly growth from 3-5 o'clock- appears to be nabothian cyst  Thin prep pap is done w/ HR HPV cotesting  UTERUS: uterus is felt to be normal size, shape, consistency and nontender   ADNEXA: No adnexal masses or tenderness noted.  Extremities:  No swelling or varicosities noted  Chaperone: Jeanann Lewandowsky, SNP   SKIN TAG REMOVAL  Informed consent obtained Skin tag sprayed w/ Hurricaine spray, clamped w/ hemostats at base, then excised w/ 11 blade scalpel. Area oozing, silver nitrate x 2 and pressure held, still oozing. Dr. Elonda Husky in, silver nitrate x 2  more and still oozing, pt held firm pressure x 10-67min and bleeding resolved, now completely hemostatic. Skin tag sent to pathology  Results for orders placed or performed in visit on 07/11/20 (from the past 24 hour(s))  POCT Urinalysis Dipstick   Collection Time: 07/11/20  2:32 PM  Result Value Ref Range   Color, UA     Clarity, UA     Glucose, UA Negative Negative   Bilirubin, UA     Ketones, UA neg    Spec Grav, UA     Blood, UA neg    pH, UA     Protein, UA  Negative Negative   Urobilinogen, UA     Nitrite, UA neg    Leukocytes, UA Trace (A) Negative   Appearance     Odor      Assessment & Plan:  1) Well-Woman Exam  2) H/O abnormal pap> repeated today  3) Large cervical nabothian cyst  4) Lt vulvar skin tag removal> per pt request, sent to pathology  5) Past due for TCS> wants to do Cologuard, note routed to Tish to order  6) Low back pain> same sx w/ UTI in past, dipstick +leuks, will send for cx  Labs/procedures today: pap, skin tag removal  Mammogram: in Oct, or sooner if problems Colonoscopy: note routed to Tish to schedule Cologuard  Orders Placed This Encounter  Procedures  . Urine Culture  . POCT Urinalysis Dipstick    Meds: No orders of the defined types were placed in this encounter.   Follow-up: Return in about 1 year (around 07/11/2021) for Physical.  [place], [facility] 07/11/2020 4:29 PM

## 2020-07-11 NOTE — Patient Instructions (Signed)
Mammogram after 10/21

## 2020-07-13 ENCOUNTER — Telehealth: Payer: Self-pay | Admitting: Women's Health

## 2020-07-13 ENCOUNTER — Encounter: Payer: Self-pay | Admitting: Orthopedic Surgery

## 2020-07-13 LAB — CYTOLOGY - PAP
Chlamydia: NEGATIVE
Comment: NEGATIVE
Comment: NEGATIVE
Comment: NORMAL
Diagnosis: NEGATIVE
High risk HPV: NEGATIVE
Neisseria Gonorrhea: NEGATIVE

## 2020-07-13 LAB — URINE CULTURE

## 2020-07-13 NOTE — Telephone Encounter (Signed)
Patient called stating that she came in for a pap the other day and they did a Urine screening for UTI and patient states she got her results on mychart but does not understand it. Please contact pt

## 2020-07-13 NOTE — Telephone Encounter (Signed)
Pt aware urine culture didn't show anything that she needs med for. Pt voiced understanding. Ann York

## 2020-07-25 ENCOUNTER — Telehealth: Payer: Self-pay | Admitting: *Deleted

## 2020-07-25 DIAGNOSIS — Z006 Encounter for examination for normal comparison and control in clinical research program: Secondary | ICD-10-CM

## 2020-07-25 NOTE — Telephone Encounter (Signed)
I called patient for 90-day phone call for Identify Study. I left message for patient to call me or respond to my email.

## 2020-08-03 ENCOUNTER — Ambulatory Visit: Payer: BC Managed Care – PPO | Admitting: Orthopedic Surgery

## 2020-08-03 ENCOUNTER — Other Ambulatory Visit: Payer: Self-pay

## 2020-08-03 ENCOUNTER — Encounter: Payer: Self-pay | Admitting: Orthopedic Surgery

## 2020-08-03 VITALS — BP 119/79 | HR 74 | Ht 65.0 in

## 2020-08-03 DIAGNOSIS — M545 Low back pain, unspecified: Secondary | ICD-10-CM | POA: Diagnosis not present

## 2020-08-03 DIAGNOSIS — M5410 Radiculopathy, site unspecified: Secondary | ICD-10-CM | POA: Diagnosis not present

## 2020-08-03 MED ORDER — MELOXICAM 7.5 MG PO TABS: 7.5000 mg | ORAL_TABLET | Freq: Every day | ORAL | 5 refills | Status: DC

## 2020-08-03 MED ORDER — PREDNISONE 10 MG (48) PO TBPK: ORAL_TABLET | Freq: Every day | ORAL | 0 refills | Status: DC

## 2020-08-03 NOTE — Patient Instructions (Addendum)
Do the home exercises Take the Prednisone taper  Take the Meloxicam AFTER the prednisone is done, make sure you take the Famotidine with the Prednisone and with the Meloxicam

## 2020-08-03 NOTE — Progress Notes (Signed)
Chief Complaint  Patient presents with  . Leg Pain    Left leg pain, Patient reports that gabapentin is helping some,    55 year old female with lower back and left leg pain has improved with gabapentin  She could not go to therapy because too much  Continues to complain of lower back pain especially when standing at the front of the store at [place] reveals tenderness in the lower back normal neurovascular exam normal hip exam  No leg pain with straight leg raising neurovascular intact  Recommend the following  [employer] Home exercise program  Medication changes  1 take prednisone Dosepak for 12 days and start meloxicam once a day  Resume famotidine  Continue gabapentin  Follow-up 3 months   Meds ordered this encounter  Medications  . predniSONE (STERAPRED UNI-PAK 48 TAB) 10 MG (48) TBPK tablet    Sig: Take by mouth daily.    Dispense:  48 tablet    Refill:  0  . meloxicam (MOBIC) 7.5 MG tablet    Sig: Take 1 tablet (7.5 mg total) by mouth daily.    Dispense:  30 tablet    Refill:  5

## 2020-08-16 ENCOUNTER — Telehealth: Payer: Self-pay

## 2020-08-16 DIAGNOSIS — Z006 Encounter for examination for normal comparison and control in clinical research program: Secondary | ICD-10-CM

## 2020-08-16 NOTE — Telephone Encounter (Signed)
Called patient for 90 day Identify phone call no answer, I left a voicemail stating the intent of the phone call and our call back number to be reached in our department. 

## 2020-08-22 ENCOUNTER — Telehealth: Payer: Self-pay | Admitting: *Deleted

## 2020-08-22 DIAGNOSIS — Z006 Encounter for examination for normal comparison and control in clinical research program: Secondary | ICD-10-CM

## 2020-08-22 NOTE — Telephone Encounter (Signed)
I called patient for 90-day Identify study phone call.I left message for patient to call me back or can answer my e-mail.

## 2020-08-28 ENCOUNTER — Other Ambulatory Visit: Payer: Self-pay | Admitting: Women's Health

## 2020-08-28 ENCOUNTER — Telehealth: Payer: Self-pay | Admitting: Adult Health

## 2020-08-28 DIAGNOSIS — N950 Postmenopausal bleeding: Secondary | ICD-10-CM

## 2020-08-28 NOTE — Telephone Encounter (Signed)
Pt aware Ann York has put in an order for a pelvic US @ [facility] . Charleston Ropes will call pt with date/time and once Ann York gets results, she will let [place] know. Pt voiced understanding. [place]

## 2020-08-28 NOTE — Telephone Encounter (Signed)
Pt had an ablation about 10 years ago. Pt saw a quarter size amount of blood yesterday evening. She did have sex yesterday morning. Pt didn't have any pain and she hasn't had any colored discharge today. Pt was told you said to let her know if she ever saw any blood. I advised I thought everything was ok but I would send note to you. Thanks!! [place]

## 2020-08-28 NOTE — Telephone Encounter (Signed)
Pt called and sstateed that she had an ablation and on 08/27/2020 she had about a quarter size amount of blood discharged but none since then, she is wanting to know if she needs to be seen.

## 2020-08-31 ENCOUNTER — Ambulatory Visit (HOSPITAL_COMMUNITY)
Admission: RE | Admit: 2020-08-31 | Discharge: 2020-08-31 | Disposition: A | Discharge status: Home / Self Care | Payer: BC Managed Care – PPO | Source: Ambulatory Visit | Attending: Women's Health | Admitting: Women's Health

## 2020-08-31 ENCOUNTER — Other Ambulatory Visit: Payer: Self-pay

## 2020-08-31 DIAGNOSIS — N950 Postmenopausal bleeding: Secondary | ICD-10-CM | POA: Diagnosis present

## 2020-09-01 ENCOUNTER — Ambulatory Visit (HOSPITAL_COMMUNITY): Payer: BC Managed Care – PPO

## 2020-09-05 ENCOUNTER — Other Ambulatory Visit: Payer: Self-pay | Admitting: Women's Health

## 2020-09-13 ENCOUNTER — Encounter: Payer: Self-pay | Admitting: *Deleted

## 2020-09-25 ENCOUNTER — Ambulatory Visit: Payer: BC Managed Care – PPO | Admitting: Obstetrics & Gynecology

## 2020-09-25 NOTE — Progress Notes (Signed)
GYN VISIT Patient name: Ann York MRN [id number]  Date of birth: 03/02/1966 Chief Complaint:   Endometrial Biopsy  History of Present Illness:   Ann York is a 55 y.o. [phone] postmenopausal female being seen today for the following.     Postmenopausal bleeding: She notes that last month had one episode of ~ tablespoon of blood.  A few days later had to wear a pantyliner x 1 day then spotting then it resolved.  She has not had any further bleeding.  No pelvic pain, no other acute complaints  [place] completed 08/2020: Uterus 7.9 x 2.9 x 4.8 cm = volume: 57.5 mL. Uterus is anteverted. Multiple fibroids seen involving the uterus  1. 2.5 x 2.2 x 2.4 cm intramural to subserosal fibroid present at the right posterior uterine body. 2. 1.2 x 1.0 x 1.2 cm exophytic fibroid extends from the anterior right uterine fundus. 3. 2.0 x 2.0 x 1.6 cm fibroid centered at the left uterine body.   Endometrium- Not visualized, obscured by overlying fibroids. Bilateral ovaries not seen  Prior endometrial ablation- has not had any bleeding since that time.  No LMP recorded. Patient has had an ablation.  Depression screen [facility] 2/9 07/11/2020 11/13/2017  Decreased Interest 0 0  Down, Depressed, Hopeless 0 0  PHQ - 2 Score 0 0  Altered sleeping 2 -  Tired, decreased energy 2 -  Change in appetite 0 -  Feeling bad or failure about yourself  0 -  Trouble concentrating 0 -  Moving slowly or fidgety/restless 0 -  Suicidal thoughts 0 -  PHQ-9 Score 4 -     Review of Systems:   Pertinent items are noted in HPI Denies fever/chills, dizziness, headaches, visual disturbances, fatigue, shortness of breath, chest pain, abdominal pain, vomiting, bowel movements, urination, or intercourse unless otherwise stated above.  Pertinent History Reviewed:  Reviewed past medical,surgical, social, obstetrical and family history.  Reviewed problem list, medications and allergies. Physical Assessment:   Vitals:   09/27/20  1536  BP: 125/80  Pulse: 89  Weight: 174 lb 9.6 oz (79.2 kg)  Height: 5\' 1"  (1.549 m)  Body mass index is 32.99 kg/m.       Physical Examination:   General appearance: alert, well appearing, and in no distress  Psych: mood appropriate, normal affect  Skin: warm & dry   Cardiovascular: normal heart rate noted  Respiratory: normal respiratory effort, no distress  Abdomen: soft, non-tender   Pelvic: VULVA: normal appearing vulva with no masses, tenderness or lesions, VAGINA: normal appearing vagina with normal color and discharge, no lesions, CERVIX: atrophic cervix appreciated  Extremities: no edema   Chaperone: Celene Squibb    ENDOMETRIAL PROCEDURE NOTE: The risks (including infection, bleeding, pain, and uterine perforation) and benefits of the procedure were explained to the patient and Written informed consent was obtained.  Antibiotic prophylaxis against endocarditis was not indicated.   The patient was placed in the dorsal lithotomy position.  Bimanual exam showed the uterus to be in the neutral position.  A speculum inserted in the vagina, and the cervix prepped with betadine.     A single tooth tenaculum was applied to the anterior lip of the cervix for stabilization.  Stenosis of cervical canal was appreciated.  Os finder was used and slightly passed within cavity to approximately 4cm.  A pipelle was inserted with return of minimal tissue.  Sample was sent for pathologic examination.  Condition: Stable  Complications: None   Assessment &  Plan:  1) Postmenopausal bleeding -Reviewed potential causes ranging from benign findings like atrophy to endometrial carcinoma.  EMB attempted today; however suspect inadequate sampling.  Concern for difficulty either due to uterine fibroid or prior scarring from ablation.  Plan for hysteroscopy, D&C for further evaluation and management.  Risk/benefit reviewed including but not limited to risk of bleeding, infection and injury due to  uterine perforation.  Questions and concerns were addressed and she desires to proceed at next available.  Long discussion with patient regarding options.  Pt very anxious about potential for cancer and unfortunately told her that without biopsy cannot rule out endometrial cancer.  Pt advised not to take xanax prior to procedure as anesthesia day off surgery would give medication to assist with anxiety.  Referral created to [facility], plan to schedule for July 26    Janyth Pupa, DO Attending [place], [facility] for [employer], [place]

## 2020-09-27 ENCOUNTER — Ambulatory Visit: Payer: BC Managed Care – PPO | Admitting: Obstetrics & Gynecology

## 2020-09-27 ENCOUNTER — Other Ambulatory Visit (HOSPITAL_COMMUNITY)
Admission: RE | Admit: 2020-09-27 | Discharge: 2020-09-27 | Disposition: A | Discharge status: Home / Self Care | Payer: BC Managed Care – PPO | Source: Ambulatory Visit | Attending: Obstetrics & Gynecology | Admitting: Obstetrics & Gynecology

## 2020-09-27 ENCOUNTER — Other Ambulatory Visit: Payer: Self-pay

## 2020-09-27 ENCOUNTER — Encounter: Payer: Self-pay | Admitting: Obstetrics & Gynecology

## 2020-09-27 VITALS — BP 125/80 | HR 89 | Ht 61.0 in | Wt 174.6 lb

## 2020-09-27 DIAGNOSIS — Z9889 Other specified postprocedural states: Secondary | ICD-10-CM

## 2020-09-27 DIAGNOSIS — N950 Postmenopausal bleeding: Secondary | ICD-10-CM | POA: Insufficient documentation

## 2020-09-27 DIAGNOSIS — F418 Other specified anxiety disorders: Secondary | ICD-10-CM | POA: Diagnosis not present

## 2020-09-29 LAB — SURGICAL PATHOLOGY

## 2020-10-02 ENCOUNTER — Ambulatory Visit: Payer: BC Managed Care – PPO | Admitting: Obstetrics & Gynecology

## 2020-10-19 ENCOUNTER — Encounter: Payer: Self-pay | Admitting: Internal Medicine

## 2020-10-19 ENCOUNTER — Ambulatory Visit: Payer: BC Managed Care – PPO | Admitting: Internal Medicine

## 2020-10-19 ENCOUNTER — Telehealth: Payer: Self-pay | Admitting: *Deleted

## 2020-10-19 ENCOUNTER — Other Ambulatory Visit: Payer: Self-pay

## 2020-10-19 VITALS — BP 116/74 | HR 80 | Temp 97.3°F | Ht 61.0 in | Wt 172.4 lb

## 2020-10-19 DIAGNOSIS — K219 Gastro-esophageal reflux disease without esophagitis: Secondary | ICD-10-CM | POA: Diagnosis not present

## 2020-10-19 DIAGNOSIS — R7401 Elevation of levels of liver transaminase levels: Secondary | ICD-10-CM

## 2020-10-19 DIAGNOSIS — Z1211 Encounter for screening for malignant neoplasm of colon: Secondary | ICD-10-CM

## 2020-10-19 DIAGNOSIS — E669 Obesity, unspecified: Secondary | ICD-10-CM | POA: Diagnosis not present

## 2020-10-19 MED ORDER — PEG 3350-KCL-NA BICARB-NACL 420 G PO SOLR: ORAL | 0 refills | Status: AC

## 2020-10-19 NOTE — Patient Instructions (Signed)
We will schedule you for colonoscopy for colon cancer screening purposes.  Congratulations on your weight loss.   Continue on famotidine for your acid reflux.    In regards to your abnormal liver function tests, if these remain abnormal despite stopping your Crestor, we may need to work-up further.  It was nice meeting you today.  At [facility] we value your feedback. You may receive a survey about your visit today. Please share your experience as we strive to create trusting relationships with our patients to provide genuine, compassionate, quality care.  We appreciate your understanding and patience as we review any laboratory studies, imaging, and other diagnostic tests that are ordered as we care for you. Our office policy is 5 business days for review of these results, and any emergent or urgent results are addressed in a timely manner for your best interest. If you do not hear from our office in 1 week, please contact us.   We also encourage the use of MyChart, which contains your medical information for your review as well. If you are not enrolled in this feature, an access code is on this after visit summary for your convenience. Thank you for allowing [place] to be involved in your care.  It was great to see you today!  I hope you have a great rest of your summer!!    [place]on Alas. [name], D.O. Gastroenterology and Hepatology [facility]

## 2020-10-19 NOTE — Telephone Encounter (Signed)
Called pt. She has been scheduled for TCS with Dr. [name] asa 2 on 8/5 at 8:45am, aware will mail prep instructions and send rx to pharmacy

## 2020-10-19 NOTE — Progress Notes (Signed)
Primary Care Physician:  Bretta Bang, MD Primary Gastroenterologist:  Dr. [name]  Chief Complaint  Patient presents with   Colonoscopy    Never had tcs. No fhcrc    HPI:   Ann York is a 55 y.o. female who presents to clinic today by referral from her PCP Dr. Malvin Johns for evaluation.  Patient has never undergone colonoscopy in the past.  No family history of colorectal malignancy.  No unintentional weight loss.  No melena hematochezia.  Does have chronic reflux which is well controlled on daily famotidine.  States she was previously trialed on pantoprazole which made her sick.  She was switched to Nexium which only helped some.  Famotidine works much better.  No dysphagia odynophagia.  No chest pain.  Did have abnormal ALT at 91 on her blood work in April.  Other LFTs WNL.  She was taken off her Crestor and is due for repeat LFTs this week.  Does note family history of liver disease in her grandmother of unknown etiology.  She had a CT scan done 2019 which showed a hemangioma of the liver.  No alcohol or drug use.  Otherwise no other complaints  Past Medical History:  Diagnosis Date   Anxiety    CAD (coronary artery disease)    Chest pain    GERD (gastroesophageal reflux disease)    Hyperlipidemia    Panic attacks    Thyroid disease     Past Surgical History:  Procedure Laterality Date   BREAST SURGERY     biopsy left breast   CESAREAN SECTION     x 2   ENDOMETRIAL ABLATION     TUBAL LIGATION      Current Outpatient Medications  Medication Sig Dispense Refill   acetaminophen (TYLENOL) 500 MG tablet Take 500 mg by mouth every 6 (six) hours as needed for mild pain or moderate pain.     ALPRAZolam (XANAX) 0.5 MG tablet Take 0.5 mg by mouth at bedtime as needed for anxiety.     Famotidine (PEPCID PO) Take by mouth daily.     gabapentin (NEURONTIN) 100 MG capsule 1 in the morning and 3 at night 90 capsule 2   Menthol-Methyl Salicylate (MUSCLE RUB) 10-15 % CREA  Apply 1 application topically as needed for muscle pain.     rosuvastatin (CRESTOR) 10 MG tablet Take 10 mg by mouth at bedtime.     VITAMIN D PO Take 1 capsule by mouth daily.     predniSONE (STERAPRED UNI-PAK 48 TAB) 10 MG (48) TBPK tablet Take by mouth daily. (Patient not taking: Reported on 10/19/2020) 48 tablet 0   No current facility-administered medications for this visit.    Allergies as of 10/19/2020 - Review Complete 10/19/2020  Allergen Reaction Noted   Septra [sulfamethoxazole-trimethoprim] Anaphylaxis 01/22/2017   Sulfa antibiotics Anaphylaxis 01/22/2020    Family History  Problem Relation Age of Onset   Rheum arthritis Mother    Hypertension Mother    Thyroid disease Mother    Stroke Mother    Hypertension Father    Heart disease Father    Hypertension Sister    Thyroid disease Sister    Hypertension Brother     Social History   Socioeconomic History   Marital status: Divorced    Spouse name: Not on file   Number of children: Not on file   Years of education: Not on file   Highest education level: Not on file  Occupational History   Not  on file  Tobacco Use   Smoking status: Former    Types: Cigarettes   Smokeless tobacco: Never  Vaping Use   Vaping Use: Never used  Substance and Sexual Activity   Alcohol use: No   Drug use: No   Sexual activity: Yes    Birth control/protection: Surgical    Comment: tubal and ablation  Other Topics Concern   Not on file  Social History Narrative   Not on file   Social Determinants of Health   Financial Resource Strain: Low Risk    Difficulty of Paying Living Expenses: Not hard at all  Food Insecurity: No Food Insecurity   Worried About Charity fundraiser in the Last Year: Never true   [place] in the Last Year: Never true  Transportation Needs: No Transportation Needs   Lack of Transportation (Medical): No   Lack of Transportation (Non-Medical): No  Physical Activity: Insufficiently Active   Days  of Exercise per Week: 2 days   Minutes of Exercise per Session: 10 min  Stress: Stress Concern Present   Feeling of Stress : Very much  Social Connections: Moderately Integrated   Frequency of Communication with Friends and Family: More than three times a week   Frequency of Social Gatherings with Friends and Family: Twice a week   Attends Religious Services: More than 4 times per year   Active Member of [employer] or Organizations: Yes   Attends Archivist Meetings: Never   Marital Status: Divorced  Human resources officer Violence: Not At Risk   Fear of Current or Ex-Partner: No   Emotionally Abused: No   Physically Abused: No   Sexually Abused: No    Subjective: Review of Systems  Constitutional:  Negative for chills and fever.  HENT:  Negative for congestion and hearing loss.   Eyes:  Negative for blurred vision and double vision.  Respiratory:  Negative for cough and shortness of breath.   Cardiovascular:  Negative for chest pain and palpitations.  Gastrointestinal:  Positive for heartburn. Negative for abdominal pain, blood in stool, constipation, diarrhea, melena and vomiting.  Genitourinary:  Negative for dysuria and urgency.  Musculoskeletal:  Negative for joint pain and myalgias.  Skin:  Negative for itching and rash.  Neurological:  Negative for dizziness and headaches.  Psychiatric/Behavioral:  Negative for depression. The patient is not nervous/anxious.       Objective: BP 116/74   Pulse 80   Temp (!) 97.3 F (36.3 C) (Temporal)   Ht 5\' 1"  (1.549 m)   Wt 172 lb 6.4 oz (78.2 kg)   BMI 32.57 kg/m  Physical Exam Constitutional:      Appearance: Normal appearance.  HENT:     Head: Normocephalic and atraumatic.  Eyes:     Extraocular Movements: Extraocular movements intact.     Conjunctiva/sclera: Conjunctivae normal.  Cardiovascular:     Rate and Rhythm: Normal rate and regular rhythm.  Pulmonary:     Effort: Pulmonary effort is normal.     Breath sounds:  Normal breath sounds.  Abdominal:     General: Bowel sounds are normal.     Palpations: Abdomen is soft.  Musculoskeletal:        General: No swelling. Normal range of motion.     Cervical back: Normal range of motion and neck supple.  Skin:    General: Skin is warm and dry.     Coloration: Skin is not jaundiced.  Neurological:     General:  No focal deficit present.     Mental Status: She is alert and oriented to person, place, and time.  Psychiatric:        Mood and Affect: Mood normal.        Behavior: Behavior normal.     Assessment: *GERD-well-controlled on famotidine *Obesity-patient has lost 3 pounds since starting keto diet *Colon cancer screening *Abnormal aminotransferases  Plan: Patient's reflux well controlled on famotidine.  She was trialed on pantoprazole which made her sick.  Also was on Nexium which she states did not help as well as the famotidine.  Symptoms well controlled.  No alarm symptoms today to warrant further investigation with EGD including dysphagia, odynophagia, chest pain.  Will schedule for screening colonoscopy.The risks including infection, bleed, or perforation as well as benefits, limitations, alternatives and imponderables have been reviewed with the patient. Questions have been answered. All parties agreeable.  I congratulated patient on her weight loss.  Currently doing well on ketogenic diet.  In regards to patient's abnormal aminotransferases, Crestor has been stopped.  She states she is due for repeat blood work this week.  If this continues to remain elevated, we may need to work-up further.  Thank you Dr. Malvin Johns for the kind referral.  10/19/2020 11:20 AM   Disclaimer: This note was dictated with voice recognition software. Similar sounding words can inadvertently be transcribed and may not be corrected upon review.

## 2020-10-19 NOTE — H&P (View-Only) (Signed)
Primary Care Physician:  Bretta Bang, MD Primary Gastroenterologist:  Dr. [name]  Chief Complaint  Patient presents with   Colonoscopy    Never had tcs. No fhcrc    HPI:   Ann York is a 55 y.o. female who presents to clinic today by referral from her PCP Dr. Malvin Johns for evaluation.  Patient has never undergone colonoscopy in the past.  No family history of colorectal malignancy.  No unintentional weight loss.  No melena hematochezia.  Does have chronic reflux which is well controlled on daily famotidine.  States she was previously trialed on pantoprazole which made her sick.  She was switched to Nexium which only helped some.  Famotidine works much better.  No dysphagia odynophagia.  No chest pain.  Did have abnormal ALT at 91 on her blood work in April.  Other LFTs WNL.  She was taken off her Crestor and is due for repeat LFTs this week.  Does note family history of liver disease in her grandmother of unknown etiology.  She had a CT scan done 2019 which showed a hemangioma of the liver.  No alcohol or drug use.  Otherwise no other complaints  Past Medical History:  Diagnosis Date   Anxiety    CAD (coronary artery disease)    Chest pain    GERD (gastroesophageal reflux disease)    Hyperlipidemia    Panic attacks    Thyroid disease     Past Surgical History:  Procedure Laterality Date   BREAST SURGERY     biopsy left breast   CESAREAN SECTION     x 2   ENDOMETRIAL ABLATION     TUBAL LIGATION      Current Outpatient Medications  Medication Sig Dispense Refill   acetaminophen (TYLENOL) 500 MG tablet Take 500 mg by mouth every 6 (six) hours as needed for mild pain or moderate pain.     ALPRAZolam (XANAX) 0.5 MG tablet Take 0.5 mg by mouth at bedtime as needed for anxiety.     Famotidine (PEPCID PO) Take by mouth daily.     gabapentin (NEURONTIN) 100 MG capsule 1 in the morning and 3 at night 90 capsule 2   Menthol-Methyl Salicylate (MUSCLE RUB) 10-15 % CREA  Apply 1 application topically as needed for muscle pain.     rosuvastatin (CRESTOR) 10 MG tablet Take 10 mg by mouth at bedtime.     VITAMIN D PO Take 1 capsule by mouth daily.     predniSONE (STERAPRED UNI-PAK 48 TAB) 10 MG (48) TBPK tablet Take by mouth daily. (Patient not taking: Reported on 10/19/2020) 48 tablet 0   No current facility-administered medications for this visit.    Allergies as of 10/19/2020 - Review Complete 10/19/2020  Allergen Reaction Noted   Septra [sulfamethoxazole-trimethoprim] Anaphylaxis 01/22/2017   Sulfa antibiotics Anaphylaxis 01/22/2020    Family History  Problem Relation Age of Onset   Rheum arthritis Mother    Hypertension Mother    Thyroid disease Mother    Stroke Mother    Hypertension Father    Heart disease Father    Hypertension Sister    Thyroid disease Sister    Hypertension Brother     Social History   Socioeconomic History   Marital status: Divorced    Spouse name: Not on file   Number of children: Not on file   Years of education: Not on file   Highest education level: Not on file  Occupational History   Not  on file  Tobacco Use   Smoking status: Former    Types: Cigarettes   Smokeless tobacco: Never  Vaping Use   Vaping Use: Never used  Substance and Sexual Activity   Alcohol use: No   Drug use: No   Sexual activity: Yes    Birth control/protection: Surgical    Comment: tubal and ablation  Other Topics Concern   Not on file  Social History Narrative   Not on file   Social Determinants of Health   Financial Resource Strain: Low Risk    Difficulty of Paying Living Expenses: Not hard at all  Food Insecurity: No Food Insecurity   Worried About Charity fundraiser in the Last Year: Never true   [place] in the Last Year: Never true  Transportation Needs: No Transportation Needs   Lack of Transportation (Medical): No   Lack of Transportation (Non-Medical): No  Physical Activity: Insufficiently Active   Days  of Exercise per Week: 2 days   Minutes of Exercise per Session: 10 min  Stress: Stress Concern Present   Feeling of Stress : Very much  Social Connections: Moderately Integrated   Frequency of Communication with Friends and Family: More than three times a week   Frequency of Social Gatherings with Friends and Family: Twice a week   Attends Religious Services: More than 4 times per year   Active Member of [employer] or Organizations: Yes   Attends Archivist Meetings: Never   Marital Status: Divorced  Human resources officer Violence: Not At Risk   Fear of Current or Ex-Partner: No   Emotionally Abused: No   Physically Abused: No   Sexually Abused: No    Subjective: Review of Systems  Constitutional:  Negative for chills and fever.  HENT:  Negative for congestion and hearing loss.   Eyes:  Negative for blurred vision and double vision.  Respiratory:  Negative for cough and shortness of breath.   Cardiovascular:  Negative for chest pain and palpitations.  Gastrointestinal:  Positive for heartburn. Negative for abdominal pain, blood in stool, constipation, diarrhea, melena and vomiting.  Genitourinary:  Negative for dysuria and urgency.  Musculoskeletal:  Negative for joint pain and myalgias.  Skin:  Negative for itching and rash.  Neurological:  Negative for dizziness and headaches.  Psychiatric/Behavioral:  Negative for depression. The patient is not nervous/anxious.       Objective: BP 116/74   Pulse 80   Temp (!) 97.3 F (36.3 C) (Temporal)   Ht 5\' 1"  (1.549 m)   Wt 172 lb 6.4 oz (78.2 kg)   BMI 32.57 kg/m  Physical Exam Constitutional:      Appearance: Normal appearance.  HENT:     Head: Normocephalic and atraumatic.  Eyes:     Extraocular Movements: Extraocular movements intact.     Conjunctiva/sclera: Conjunctivae normal.  Cardiovascular:     Rate and Rhythm: Normal rate and regular rhythm.  Pulmonary:     Effort: Pulmonary effort is normal.     Breath sounds:  Normal breath sounds.  Abdominal:     General: Bowel sounds are normal.     Palpations: Abdomen is soft.  Musculoskeletal:        General: No swelling. Normal range of motion.     Cervical back: Normal range of motion and neck supple.  Skin:    General: Skin is warm and dry.     Coloration: Skin is not jaundiced.  Neurological:     General:  No focal deficit present.     Mental Status: She is alert and oriented to person, place, and time.  Psychiatric:        Mood and Affect: Mood normal.        Behavior: Behavior normal.     Assessment: *GERD-well-controlled on famotidine *Obesity-patient has lost 3 pounds since starting keto diet *Colon cancer screening *Abnormal aminotransferases  Plan: Patient's reflux well controlled on famotidine.  She was trialed on pantoprazole which made her sick.  Also was on Nexium which she states did not help as well as the famotidine.  Symptoms well controlled.  No alarm symptoms today to warrant further investigation with EGD including dysphagia, odynophagia, chest pain.  Will schedule for screening colonoscopy.The risks including infection, bleed, or perforation as well as benefits, limitations, alternatives and imponderables have been reviewed with the patient. Questions have been answered. All parties agreeable.  I congratulated patient on her weight loss.  Currently doing well on ketogenic diet.  In regards to patient's abnormal aminotransferases, Crestor has been stopped.  She states she is due for repeat blood work this week.  If this continues to remain elevated, we may need to work-up further.  Thank you Dr. Malvin Johns for the kind referral.  10/19/2020 11:20 AM   Disclaimer: This note was dictated with voice recognition software. Similar sounding words can inadvertently be transcribed and may not be corrected upon review.

## 2020-10-25 NOTE — Patient Instructions (Signed)
Ann York  10/25/2020     @PREFPERIOPPHARMACY @   Your procedure is scheduled on  10/31/2020.   Report to [facility] at  0700  A.M.   Call this number if you have problems the morning of surgery:  [phone]   Remember:  Do not eat or drink after midnight.      Take these medicines the morning of surgery with A SIP OF WATER           pepcid, gabapentin.     Do not wear jewelry, make-up or nail polish.  Do not wear lotions, powders, or perfumes, or deodorant.  Do not shave 48 hours prior to surgery.  Men may shave face and neck.  Do not bring valuables to the hospital.  [facility] is not responsible for any belongings or valuables.  Contacts, dentures or bridgework may not be worn into surgery.  Leave your suitcase in the car.  After surgery it may be brought to your room.  For patients admitted to the hospital, discharge time will be determined by your treatment team.  Patients discharged the day of surgery will not be allowed to drive home and must have someone with them for 24 hours.    Special instructions:     DO NOT smoke tobacco or vape for 24 hours before your procedure.  Please read over the following fact sheets that you were given. Coughing and Deep Breathing, Surgical Site Infection Prevention, Anesthesia Post-op Instructions, and Care and Recovery After Surgery      Dilation and Curettage or Vacuum Curettage, Care After The following information offers guidance on how to care for yourself after your procedure. Your doctor may also give you more specific instructions. Ifyou have problems or questions, contact your doctor. What can I expect after the procedure? After the procedure, it is common to have: Mild pain or cramps. Some bleeding or spotting from the vagina. These may last for up to 2 weeks. Follow these instructions at home: Medicines Take over-the-counter and prescription medicines only as told by your doctor. If told, take  steps to prevent problems with pooping (constipation). You may need to: Drink enough fluid to keep your pee (urine) pale yellow. Take medicines. You will be told what medicines to take. Eat foods that are high in fiber. These include beans, whole grains, and fresh fruits and vegetables. Limit foods that are high in fat and sugar. These include fried or sweet foods. Ask your doctor if you should avoid driving or using machines while you are taking your medicine. Activity  If you were given a medicine to help you relax (sedative) during your procedure, it can affect you for many hours. Do not drive or use machinery until your doctor says that it is safe. Rest as told by your doctor. Get up to take short walks every 1-2 hours. Ask for help if you feel weak or unsteady. Do not lift anything that is heavier than 10 lb (4.5 kg), or the limit that you are told. Return to your normal activities when your doctor says that it is safe.  Lifestyle For at least 2 weeks, or as long as told by your doctor: Do not douche. Do not use tampons. Do not have sex. General instructions Do not take baths, swim, or use a hot tub. Ask your doctor if you may take showers. Do not smoke or use any products that contain nicotine or tobacco. These can delay  healing. If you need help quitting, ask your doctor. Wear compression stockings as told by your doctor. It is up to you to get the results of your procedure. Ask how to get your results when they are ready. Keep all follow-up visits. Contact a doctor if: You have very bad cramps that get worse or do not get better with medicine. You have very bad pain in your belly (abdomen). You cannot drink fluids without vomiting. You have pain in the area just above your thighs. You have fluid from your vagina that smells bad. You have a rash. Get help right away if: You are bleeding a lot from your vagina. This means soaking more than one sanitary pad in 1 hour, and this  happens for 2 hours in a row. You have a fever that is above 100.57F (38C). Your belly feels very tender or hard. You have chest pain. You have trouble breathing. You feel dizzy or light-headed. You faint. You have pain in your neck or shoulder area. These symptoms may be an emergency. Get help right away. Call your local emergency services (911 in the [place] Do not wait to see if the symptoms will go away. Do not drive yourself to the hospital. Summary After your procedure, it is common to have pain or cramping. It is also common to have bleeding or spotting from your vagina. Rest as told. Get up to take short walks every 1-2 hours. Do not lift anything that is heavier than 10 lb (4.5 kg), or the limit that you are told. Get help right away if you have problems from the procedure. Ask your doctor what problems to watch for. This information is not intended to replace advice given to you by your health care provider. Make sure you discuss any questions you have with your healthcare provider. Document Revised: 03/15/2020 Document Reviewed: 03/15/2020 Elsevier Patient Education  2022 [place] Anesthesia, Adult, Care After This sheet gives you information about how to care for yourself after your procedure. Your health care provider may also give you more specific instructions. If you have problems or questions, contact your health careprovider. What can I expect after the procedure? After the procedure, the following side effects are common: Pain or discomfort at the IV site. Nausea. Vomiting. Sore throat. Trouble concentrating. Feeling cold or chills. Feeling weak or tired. Sleepiness and fatigue. Soreness and body aches. These side effects can affect parts of the body that were not involved in surgery. Follow these instructions at home: For the time period you were told by your health care provider:  Rest. Do not participate in activities where you could fall or  become injured. Do not drive or use machinery. Do not drink alcohol. Do not take sleeping pills or medicines that cause drowsiness. Do not make important decisions or sign legal documents. Do not take care of children on your own.  Eating and drinking Follow any instructions from your health care provider about eating or drinking restrictions. When you feel hungry, start by eating small amounts of foods that are soft and easy to digest (bland), such as toast. Gradually return to your regular diet. Drink enough fluid to keep your urine pale yellow. If you vomit, rehydrate by drinking water, juice, or clear broth. General instructions If you have sleep apnea, surgery and certain medicines can increase your risk for breathing problems. Follow instructions from your health care provider about wearing your sleep device: Anytime you are sleeping, including during daytime naps. While taking  prescription pain medicines, sleeping medicines, or medicines that make you drowsy. Have a responsible adult stay with you for the time you are told. It is important to have someone help care for you until you are awake and alert. Return to your normal activities as told by your health care provider. Ask your health care provider what activities are safe for you. Take over-the-counter and prescription medicines only as told by your health care provider. If you smoke, do not smoke without supervision. Keep all follow-up visits as told by your health care provider. This is important. Contact a health care provider if: You have nausea or vomiting that does not get better with medicine. You cannot eat or drink without vomiting. You have pain that does not get better with medicine. You are unable to pass urine. You develop a skin rash. You have a fever. You have redness around your IV site that gets worse. Get help right away if: You have difficulty breathing. You have chest pain. You have blood in your urine  or stool, or you vomit blood. Summary After the procedure, it is common to have a sore throat or nausea. It is also common to feel tired. Have a responsible adult stay with you for the time you are told. It is important to have someone help care for you until you are awake and alert. When you feel hungry, start by eating small amounts of foods that are soft and easy to digest (bland), such as toast. Gradually return to your regular diet. Drink enough fluid to keep your urine pale yellow. Return to your normal activities as told by your health care provider. Ask your health care provider what activities are safe for you. This information is not intended to replace advice given to you by your health care provider. Make sure you discuss any questions you have with your healthcare provider. Document Revised: 12/09/2019 Document Reviewed: 07/08/2019 Elsevier Patient Education  2022 [place]. How to Use Chlorhexidine for Bathing Chlorhexidine gluconate (CHG) is a germ-killing (antiseptic) solution that is used to clean the skin. It can get rid of the bacteria that normally live on the skin and can keep them away for about 24 hours. To clean your skin with CHG, you may be given: A CHG solution to use in the shower or as part of a sponge bath. A prepackaged cloth that contains CHG. Cleaning your skin with CHG may help lower the risk for infection: While you are staying in the intensive care unit of the hospital. If you have a vascular access, such as a central line, to provide short-term or long-term access to your veins. If you have a catheter to drain urine from your bladder. If you are on a ventilator. A ventilator is a machine that helps you breathe by moving air in and out of your lungs. After surgery. What are the risks? Risks of using CHG include: A skin reaction. Hearing loss, if CHG gets in your ears. Eye injury, if CHG gets in your eyes and is not rinsed out. The CHG product catching  fire. Make sure that you avoid smoking and flames after applying CHG to your skin. Do not use CHG: If you have a chlorhexidine allergy or have previously reacted to chlorhexidine. On babies younger than 74 months of age. How to use CHG solution Use CHG only as told by your health care provider, and follow the instructions on the label. Use the full amount of CHG as directed. Usually, this is  one bottle. During a shower Follow these steps when using CHG solution during a shower (unless your health care provider gives you different instructions): Start the shower. Use your normal soap and shampoo to wash your face and hair. Turn off the shower or move out of the shower stream. Pour the CHG onto a clean washcloth. Do not use any type of brush or rough-edged sponge. Starting at your neck, lather your body down to your toes. Make sure you follow these instructions: If you will be having surgery, pay special attention to the part of your body where you will be having surgery. Scrub this area for at least 1 minute. Do not use CHG on your head or face. If the solution gets into your ears or eyes, rinse them well with water. Avoid your genital area. Avoid any areas of skin that have broken skin, cuts, or scrapes. Scrub your back and under your arms. Make sure to wash skin folds. Let the lather sit on your skin for 1-2 minutes or as long as told by your health care provider. Thoroughly rinse your entire body in the shower. Make sure that all body creases and crevices are rinsed well. Dry off with a clean towel. Do not put any substances on your body afterward--such as powder, lotion, or perfume--unless you are told to do so by your health care provider. Only use lotions that are recommended by the manufacturer. Put on clean clothes or pajamas. If it is the night before your surgery, sleep in clean sheets.  During a sponge bath Follow these steps when using CHG solution during a sponge bath (unless  your health care provider gives you different instructions): Use your normal soap and shampoo to wash your face and hair. Pour the CHG onto a clean washcloth. Starting at your neck, lather your body down to your toes. Make sure you follow these instructions: If you will be having surgery, pay special attention to the part of your body where you will be having surgery. Scrub this area for at least 1 minute. Do not use CHG on your head or face. If the solution gets into your ears or eyes, rinse them well with water. Avoid your genital area. Avoid any areas of skin that have broken skin, cuts, or scrapes. Scrub your back and under your arms. Make sure to wash skin folds. Let the lather sit on your skin for 1-2 minutes or as long as told by your health care provider. Using a different clean, wet washcloth, thoroughly rinse your entire body. Make sure that all body creases and crevices are rinsed well. Dry off with a clean towel. Do not put any substances on your body afterward--such as powder, lotion, or perfume--unless you are told to do so by your health care provider. Only use lotions that are recommended by the manufacturer. Put on clean clothes or pajamas. If it is the night before your surgery, sleep in clean sheets. How to use CHG prepackaged cloths Only use CHG cloths as told by your health care provider, and follow the instructions on the label. Use the CHG cloth on clean, dry skin. Do not use the CHG cloth on your head or face unless your health care provider tells you to. When washing with the CHG cloth: Avoid your genital area. Avoid any areas of skin that have broken skin, cuts, or scrapes. Before surgery Follow these steps when using a CHG cloth to clean before surgery (unless your health care provider gives you different  instructions): Using the CHG cloth, vigorously scrub the part of your body where you will be having surgery. Scrub using a back-and-forth motion for 3 minutes. The  area on your body should be completely wet with CHG when you are done scrubbing. Do not rinse. Discard the cloth and let the area air-dry. Do not put any substances on the area afterward, such as powder, lotion, or perfume. Put on clean clothes or pajamas. If it is the night before your surgery, sleep in clean sheets.  For general bathing Follow these steps when using CHG cloths for general bathing (unless your health care provider gives you different instructions). Use a separate CHG cloth for each area of your body. Make sure you wash between any folds of skin and between your fingers and toes. Wash your body in the following order, switching to a new cloth after each step: The front of your neck, shoulders, and chest. Both of your arms, under your arms, and your hands. Your stomach and groin area, avoiding the genitals. Your right leg and foot. Your left leg and foot. The back of your neck, your back, and your buttocks. Do not rinse. Discard the cloth and let the area air-dry. Do not put any substances on your body afterward--such as powder, lotion, or perfume--unless you are told to do so by your health care provider. Only use lotions that are recommended by the manufacturer. Put on clean clothes or pajamas. Contact a health care provider if: Your skin gets irritated after scrubbing. You have questions about using your solution or cloth. Get help right away if: Your eyes become very red or swollen. Your eyes itch badly. Your skin itches badly and is red or swollen. Your hearing changes. You have trouble seeing. You have swelling or tingling in your mouth or throat. You have trouble breathing. You swallow any chlorhexidine. Summary Chlorhexidine gluconate (CHG) is a germ-killing (antiseptic) solution that is used to clean the skin. Cleaning your skin with CHG may help to lower your risk for infection. You may be given CHG to use for bathing. It may be in a bottle or in a prepackaged  cloth to use on your skin. Carefully follow your health care provider's instructions and the instructions on the product label. Do not use CHG if you have a chlorhexidine allergy. Contact your health care provider if your skin gets irritated after scrubbing. This information is not intended to replace advice given to you by your health care provider. Make sure you discuss any questions you have with your healthcare provider. Document Revised: 08/06/2019 Document Reviewed: 09/10/2019 Elsevier Patient Education  [place].

## 2020-10-26 ENCOUNTER — Encounter (HOSPITAL_COMMUNITY)
Admission: RE | Admit: 2020-10-26 | Discharge: 2020-10-26 | Disposition: A | Discharge status: Home / Self Care | Payer: BC Managed Care – PPO | Source: Ambulatory Visit | Attending: Obstetrics & Gynecology | Admitting: Obstetrics & Gynecology

## 2020-10-26 ENCOUNTER — Other Ambulatory Visit: Payer: Self-pay

## 2020-10-26 ENCOUNTER — Encounter (HOSPITAL_COMMUNITY): Payer: Self-pay

## 2020-10-26 DIAGNOSIS — Z01812 Encounter for preprocedural laboratory examination: Secondary | ICD-10-CM | POA: Diagnosis present

## 2020-10-26 HISTORY — DX: Hypothyroidism, unspecified: E03.9

## 2020-10-26 HISTORY — DX: Unspecified osteoarthritis, unspecified site: M19.90

## 2020-10-26 LAB — CBC
HCT: 40.7 % (ref 36.0–46.0)
Hemoglobin: 13 g/dL (ref 12.0–15.0)
MCH: 29.8 pg (ref 26.0–34.0)
MCHC: 31.9 g/dL (ref 30.0–36.0)
MCV: 93.3 fL (ref 80.0–100.0)
Platelets: 209 10*3/uL (ref 150–400)
RBC: 4.36 MIL/uL (ref 3.87–5.11)
RDW: 12.9 % (ref 11.5–15.5)
WBC: 4 10*3/uL (ref 4.0–10.5)
nRBC: 0 % (ref 0.0–0.2)

## 2020-10-26 LAB — BASIC METABOLIC PANEL
Anion gap: 10 (ref 5–15)
BUN: 18 mg/dL (ref 6–20)
CO2: 24 mmol/L (ref 22–32)
Calcium: 9 mg/dL (ref 8.9–10.3)
Chloride: 103 mmol/L (ref 98–111)
Creatinine, Ser: 0.59 mg/dL (ref 0.44–1.00)
GFR, Estimated: >60 mL/min (ref >60)
Glucose, Bld: 83 mg/dL (ref 70–99)
Potassium: 3.9 mmol/L (ref 3.5–5.1)
Sodium: 137 mmol/L (ref 135–145)

## 2020-10-27 ENCOUNTER — Encounter (HOSPITAL_COMMUNITY): Payer: BC Managed Care – PPO

## 2020-10-29 ENCOUNTER — Other Ambulatory Visit: Payer: Self-pay | Admitting: Obstetrics & Gynecology

## 2020-10-29 MED ORDER — MISOPROSTOL 200 MCG PO TABS: 200.0000 ug | ORAL_TABLET | Freq: Once | ORAL | 0 refills | Status: DC

## 2020-10-29 NOTE — Progress Notes (Signed)
Premedication for upcoming surgery

## 2020-10-29 NOTE — H&P (Addendum)
[facility] Attending History and Physical  Ann York is a 55 y.o. [phone] postmenopausal female who presents for hysteroscopy, D&C due to postmenopausal bleeding and inability to complete in office EMB with thickened endometrium.  In review, she notes that last mo. She had an episode of BRB about a tablespoon-worth.  Then continue to have spotting a few days later.  She has not had further bleeding. Denies any abnormal vaginal discharge, fevers, chills, sweats, dysuria, nausea, vomiting, other GI or GU symptoms or other general symptoms.  [place] completed 08/2020: Uterus 7.9 x 2.9 x 4.8 cm = volume: 57.5 mL. Uterus is anteverted. Multiple fibroids seen involving the uterus  1. 2.5 x 2.2 x 2.4 cm intramural to subserosal fibroid present at the right posterior uterine body. 2. 1.2 x 1.0 x 1.2 cm exophytic fibroid extends from the anterior right uterine fundus. 3. 2.0 x 2.0 x 1.6 cm fibroid centered at the left uterine body.   Endometrium- Not visualized, obscured by overlying fibroids. Bilateral ovaries not seen  Past Medical History:  Diagnosis Date   Anxiety    Arthritis    CAD (coronary artery disease)    Chest pain    GERD (gastroesophageal reflux disease)    Hyperlipidemia    Hypothyroidism    Panic attacks    Thyroid disease    Past Surgical History:  Procedure Laterality Date   BREAST SURGERY     biopsy left breast   CESAREAN SECTION     x 2   ENDOMETRIAL ABLATION     TUBAL LIGATION     OB History  Gravida Para Term Preterm AB Living  '4 3 3   1 3  '$ SAB IAB Ectopic Multiple Live Births  1            # Outcome Date GA Lbr Len/2nd Weight Sex Delivery Anes PTL Lv  4 SAB           3 Term           2 Term           1 Term           Patient denies any other pertinent gynecologic issues.  No current facility-administered medications on file prior to encounter.   Current Outpatient Medications on File Prior to Encounter  Medication Sig  Dispense Refill   acetaminophen (TYLENOL) 500 MG tablet Take 1,000 mg by mouth every 6 (six) hours as needed for mild pain or moderate pain.     ALPRAZolam (XANAX) 0.5 MG tablet Take 0.5 mg by mouth at bedtime as needed for anxiety.     cholecalciferol (VITAMIN D3) 25 MCG (1000 UNIT) tablet Take 2,000 Units by mouth daily.     famotidine (PEPCID) 40 MG tablet Take 40 mg by mouth daily.     gabapentin (NEURONTIN) 100 MG capsule 1 in the morning and 3 at night (Patient taking differently: Take 100-200 mg by mouth See admin instructions. Take 100 mg in the morning and 200 mg at night) 90 capsule 2   thyroid (ARMOUR) 30 MG tablet Take 30 mg by mouth daily before breakfast.     predniSONE (STERAPRED UNI-PAK 48 TAB) 10 MG (48) TBPK tablet Take by mouth daily. (Patient not taking: No sig reported) 48 tablet 0   [DISCONTINUED] esomeprazole (NEXIUM) 40 MG capsule Take 40 mg by mouth daily before breakfast.       [DISCONTINUED] sertraline (ZOLOFT) 50 MG tablet Take 50 mg by mouth  daily.       Allergies  Allergen Reactions   Septra [Sulfamethoxazole-Trimethoprim] Anaphylaxis   Sulfa Antibiotics Anaphylaxis    Social History:   reports that she quit smoking about 28 years ago. Her smoking use included cigarettes. She has a 0.25 pack-year smoking history. She has never used smokeless tobacco. She reports that she does not drink alcohol and does not use drugs. Family History  Problem Relation Age of Onset   Rheum arthritis Mother    Hypertension Mother    Thyroid disease Mother    Stroke Mother    Hypertension Father    Heart disease Father    Hypertension Sister    Thyroid disease Sister    Hypertension Brother     Review of Systems: Pertinent items noted in HPI and remainder of comprehensive ROS otherwise negative.  PHYSICAL EXAM: BP 129/73   Pulse 73   Temp 97.8 F (36.6 C) (Oral)   Resp 14   SpO2 100%   GEn; NAD CV: RRR Lungs: CTAB Abd: soft, non-tender, no rebound, no  guarding Ext: no calf tenderness, no edema Psych: mood appropriate  Labs: Results for orders placed or performed during the hospital encounter of 10/26/20 (from the past 336 hour(s))  CBC   Collection Time: 10/26/20  3:16 PM  Result Value Ref Range   WBC 4.0 4.0 - 10.5 K/uL   RBC 4.36 3.87 - 5.11 MIL/uL   Hemoglobin 13.0 12.0 - 15.0 g/dL   HCT 40.7 36.0 - 46.0 %   MCV 93.3 80.0 - 100.0 fL   MCH 29.8 26.0 - 34.0 pg   MCHC 31.9 30.0 - 36.0 g/dL   RDW 12.9 11.5 - 15.5 %   Platelets 209 150 - 400 K/uL   nRBC 0.0 0.0 - 0.2 %  Basic metabolic panel   Collection Time: 10/26/20  3:16 PM  Result Value Ref Range   Sodium 137 135 - 145 mmol/L   Potassium 3.9 3.5 - 5.1 mmol/L   Chloride 103 98 - 111 mmol/L   CO2 24 22 - 32 mmol/L   Glucose, Bld 83 70 - 99 mg/dL   BUN 18 6 - 20 mg/dL   Creatinine, Ser 0.59 0.44 - 1.00 mg/dL   Calcium 9.0 8.9 - 10.3 mg/dL   GFR, Estimated >60 >60 mL/min   Anion gap 10 5 - 15    Imaging Studies:  see HPI  Assessment: Postmenopausal bleeding Thickened endometrium  Plan: Plan for hysteroscopy, D&C -NPO -LR @ 125cc/hr -SCDs to OR -Risk/benefit reviewed including but not limited to risk of bleeding, infection and injury such as uterine perforation requiring further surgical intervention.  Questions and concerns were addressed and she desires to proceed  Janyth Pupa, DO Attending [place], [place] [facility] [employer], [place]

## 2020-10-30 ENCOUNTER — Telehealth: Payer: Self-pay | Admitting: Obstetrics & Gynecology

## 2020-10-31 ENCOUNTER — Ambulatory Visit (HOSPITAL_COMMUNITY): Payer: BC Managed Care – PPO | Admitting: Certified Registered Nurse Anesthetist

## 2020-10-31 ENCOUNTER — Encounter (HOSPITAL_COMMUNITY)
Admission: RE | Disposition: A | Discharge status: Home / Self Care | Payer: Self-pay | Source: Home / Self Care | Attending: Obstetrics & Gynecology

## 2020-10-31 ENCOUNTER — Ambulatory Visit (HOSPITAL_COMMUNITY)
Admission: RE | Admit: 2020-10-31 | Discharge: 2020-10-31 | Disposition: A | Discharge status: Home / Self Care | Payer: BC Managed Care – PPO | Attending: Obstetrics & Gynecology | Admitting: Obstetrics & Gynecology

## 2020-10-31 ENCOUNTER — Other Ambulatory Visit: Payer: Self-pay

## 2020-10-31 ENCOUNTER — Encounter (HOSPITAL_COMMUNITY): Payer: Self-pay | Admitting: Obstetrics & Gynecology

## 2020-10-31 DIAGNOSIS — N854 Malposition of uterus: Secondary | ICD-10-CM | POA: Diagnosis not present

## 2020-10-31 DIAGNOSIS — Z823 Family history of stroke: Secondary | ICD-10-CM | POA: Insufficient documentation

## 2020-10-31 DIAGNOSIS — Z8261 Family history of arthritis: Secondary | ICD-10-CM | POA: Diagnosis not present

## 2020-10-31 DIAGNOSIS — R9389 Abnormal findings on diagnostic imaging of other specified body structures: Secondary | ICD-10-CM | POA: Insufficient documentation

## 2020-10-31 DIAGNOSIS — Z79899 Other long term (current) drug therapy: Secondary | ICD-10-CM | POA: Insufficient documentation

## 2020-10-31 DIAGNOSIS — Z882 Allergy status to sulfonamides status: Secondary | ICD-10-CM | POA: Insufficient documentation

## 2020-10-31 DIAGNOSIS — Z8249 Family history of ischemic heart disease and other diseases of the circulatory system: Secondary | ICD-10-CM | POA: Diagnosis not present

## 2020-10-31 DIAGNOSIS — Z7952 Long term (current) use of systemic steroids: Secondary | ICD-10-CM | POA: Diagnosis not present

## 2020-10-31 DIAGNOSIS — Z8349 Family history of other endocrine, nutritional and metabolic diseases: Secondary | ICD-10-CM | POA: Diagnosis not present

## 2020-10-31 DIAGNOSIS — N950 Postmenopausal bleeding: Secondary | ICD-10-CM | POA: Diagnosis present

## 2020-10-31 DIAGNOSIS — Z881 Allergy status to other antibiotic agents status: Secondary | ICD-10-CM | POA: Insufficient documentation

## 2020-10-31 DIAGNOSIS — Z87891 Personal history of nicotine dependence: Secondary | ICD-10-CM | POA: Diagnosis not present

## 2020-10-31 HISTORY — PX: HYSTEROSCOPY: SHX211

## 2020-10-31 SURGERY — HYSTEROSCOPY
Anesthesia: General

## 2020-10-31 MED ORDER — CHLORHEXIDINE GLUCONATE 0.12 % MT SOLN: 15.0000 mL | Freq: Once | OROMUCOSAL | Status: DC

## 2020-10-31 MED ORDER — LACTATED RINGERS IV SOLN: INTRAVENOUS | Status: DC

## 2020-10-31 MED ORDER — SODIUM CHLORIDE 0.9 % IR SOLN
Status: DC | PRN
  Administered 2020-10-31: 1000 mL
  Administered 2020-10-31: 3000 mL

## 2020-10-31 MED ORDER — PROPOFOL 10 MG/ML IV BOLUS
INTRAVENOUS | Status: AC
  Filled 2020-10-31: qty 40

## 2020-10-31 MED ORDER — ONDANSETRON HCL 4 MG/2ML IJ SOLN
INTRAMUSCULAR | Status: DC | PRN
  Administered 2020-10-31: 4 mg via INTRAVENOUS

## 2020-10-31 MED ORDER — SOD CITRATE-CITRIC ACID 500-334 MG/5ML PO SOLN: 30.0000 mL | ORAL | Status: DC

## 2020-10-31 MED ORDER — MIDAZOLAM HCL 2 MG/2ML IJ SOLN
INTRAMUSCULAR | Status: AC
  Filled 2020-10-31: qty 2

## 2020-10-31 MED ORDER — PROPOFOL 10 MG/ML IV BOLUS
INTRAVENOUS | Status: DC | PRN
  Administered 2020-10-31: 150 mg via INTRAVENOUS

## 2020-10-31 MED ORDER — ONDANSETRON HCL 4 MG/2ML IJ SOLN: 4.0000 mg | Freq: Once | INTRAMUSCULAR | Status: DC | PRN

## 2020-10-31 MED ORDER — MIDAZOLAM HCL 5 MG/5ML IJ SOLN
INTRAMUSCULAR | Status: DC | PRN
  Administered 2020-10-31 (×2): 1 mg via INTRAVENOUS

## 2020-10-31 MED ORDER — ORAL CARE MOUTH RINSE: 15.0000 mL | Freq: Once | OROMUCOSAL | Status: DC

## 2020-10-31 MED ORDER — GLYCOPYRROLATE 0.2 MG/ML IJ SOLN
INTRAMUSCULAR | Status: DC | PRN
  Administered 2020-10-31: .2 mg via INTRAVENOUS

## 2020-10-31 MED ORDER — FENTANYL CITRATE (PF) 100 MCG/2ML IJ SOLN: 25.0000 ug | INTRAMUSCULAR | Status: DC | PRN

## 2020-10-31 MED ORDER — PROGESTERONE MICRONIZED 100 MG PO CAPS: 100.0000 mg | ORAL_CAPSULE | Freq: Every day | ORAL | 1 refills | Status: DC

## 2020-10-31 MED ORDER — LIDOCAINE-EPINEPHRINE (PF) 1 %-1:200000 IJ SOLN
INTRAMUSCULAR | Status: DC | PRN
  Administered 2020-10-31: 10 mL

## 2020-10-31 MED ORDER — POVIDONE-IODINE 10 % EX SWAB: 2.0000 "application " | Freq: Once | CUTANEOUS | Status: DC

## 2020-10-31 MED ORDER — DEXAMETHASONE SODIUM PHOSPHATE 10 MG/ML IJ SOLN
INTRAMUSCULAR | Status: DC | PRN
  Administered 2020-10-31: 8 mg via INTRAVENOUS

## 2020-10-31 MED ORDER — LIDOCAINE HCL (CARDIAC) PF 100 MG/5ML IV SOSY
PREFILLED_SYRINGE | INTRAVENOUS | Status: DC | PRN
  Administered 2020-10-31: 60 mg via INTRAVENOUS

## 2020-10-31 MED ORDER — FENTANYL CITRATE (PF) 100 MCG/2ML IJ SOLN
INTRAMUSCULAR | Status: DC | PRN
  Administered 2020-10-31 (×4): 25 ug via INTRAVENOUS

## 2020-10-31 MED ORDER — LACTATED RINGERS IV SOLN: INTRAVENOUS | Status: DC | PRN

## 2020-10-31 MED ORDER — FENTANYL CITRATE (PF) 100 MCG/2ML IJ SOLN
INTRAMUSCULAR | Status: AC
  Filled 2020-10-31: qty 2

## 2020-10-31 SURGICAL SUPPLY — 17 items
CATH ROBINSON RED A/P 16FR (CATHETERS) ×3 IMPLANT
COVER LIGHT HANDLE STERIS (MISCELLANEOUS) ×4 IMPLANT
COVER MAYO STAND XLG (MISCELLANEOUS) ×2 IMPLANT
DILATOR CANAL MILEX (MISCELLANEOUS) ×2 IMPLANT
GAUZE 4X4 16PLY ~~LOC~~+RFID DBL (SPONGE) ×4 IMPLANT
GLOVE SURG LTX SZ6.5 (GLOVE) ×3 IMPLANT
GLOVE SURG UNDER POLY LF SZ7 (GLOVE) ×6 IMPLANT
GOWN STRL REUS W/ TWL LRG LVL3 (GOWN DISPOSABLE) ×4 IMPLANT
GOWN STRL REUS W/TWL LRG LVL3 (GOWN DISPOSABLE) ×6
KIT PROCEDURE FLUENT (KITS) ×3 IMPLANT
KIT TURNOVER KIT B (KITS) ×3 IMPLANT
NDL HYPO 18GX1.5 BLUNT FILL (NEEDLE) IMPLANT
NEEDLE HYPO 18GX1.5 BLUNT FILL (NEEDLE) ×3 IMPLANT
PACK VAGINAL MINOR WOMEN LF (CUSTOM PROCEDURE TRAY) ×3 IMPLANT
SEAL ROD LENS SCOPE MYOSURE (ABLATOR) ×3 IMPLANT
SYR CONTROL 10ML LL (SYRINGE) ×2 IMPLANT
TOWEL GREEN STERILE FF (TOWEL DISPOSABLE) ×4 IMPLANT

## 2020-10-31 NOTE — Op Note (Signed)
Operative Report  PreOp: postmenopausal bleeding PostOp: same Procedure:  Diagnostic hysteroscopy Surgeon: Dr. Janyth Pupa Anesthesia: General Complications:none EBL: Minimal UOP: 100c IVF:900cc Discrepancy: 625cc  Findings: Anteverted uterus, endometrial scarring noted  Specimens: none  Procedure: The patient was taken to the operating room where she underwent general anesthesia without difficulty. The patient was placed in a low lithotomy position using Allen stirrups. The patient was examined with the findings as noted above.  She was then prepped and draped in the normal sterile fashion. The bladder was drained using a red rubber urethral catheter. A sterile speculum was inserted into the vagina. Cervical block was completed using 1% lidocaine with epinephrine.  A single tooth tenaculum was placed on the anterior lip of the cervix.  Thickening/scarring within the endometrial cavity was appreciated.  Os finder was used to attempt to find the endometrial canal.  Os finder placed smoothly, dilators were then used up to 18 [place].  The diagnostic hysteroscope was then inserted without difficulty; however there was concern for a false track as the ostia were not visualized.  There was not a perforation seen and the discrepancy remained stable.  The hysteroscope was slowly retracted and a small posterior opening was visualized.  Attempts were made to enter this cavity with the small hysteroscope; however, this was unsuccessful.  Visualization was achieved using NS as a distending medium. Endometrial sampling was not able to be obtained. All instrument were then removed. Hemostasis was observed at the cervical site. The patient was repositioned to the supine position. The patient tolerated the procedure without any complications and taken to recovery in stable condition.   Janyth Pupa, DO [phone] (pager) [phone] (office)

## 2020-10-31 NOTE — Interval H&P Note (Signed)
History and Physical Interval Note:  10/31/2020 8:20 AM  Ann York  has presented today for surgery, with the diagnosis of Postmenopausal bleeding.  The various methods of treatment have been discussed with the patient and family. After consideration of risks, benefits and other options for treatment, the patient has consented to  Procedure(s): DILATATION AND CURETTAGE /HYSTEROSCOPY (N/A) as a surgical intervention.  The patient's history has been reviewed, patient examined, no change in status, stable for surgery.  I have reviewed the patient's chart and labs.  Questions were answered to the patient's satisfaction.     [name]

## 2020-10-31 NOTE — Anesthesia Procedure Notes (Signed)
Procedure Name: LMA Insertion Date/Time: 10/31/2020 8:58 AM Performed by: Gayland Curry, CRNA Pre-anesthesia Checklist: Patient identified, Emergency Drugs available, Suction available and Patient being monitored Patient Re-evaluated:Patient Re-evaluated prior to induction Oxygen Delivery Method: Circle system utilized Preoxygenation: Pre-oxygenation with 100% oxygen Induction Type: IV induction LMA: LMA inserted LMA Size: 4.0 Number of attempts: 1 Placement Confirmation: positive ETCO2 and breath sounds checked- equal and bilateral Tube secured with: Tape Dental Injury: Teeth and Oropharynx as per pre-operative assessment

## 2020-10-31 NOTE — Discharge Instructions (Addendum)
HOME INSTRUCTIONS  Please note any unusual or excessive bleeding, pain, swelling. Mild dizziness or drowsiness are normal for about 24 hours after surgery.   Shower when comfortable  Restrictions: No driving for 24 hours or while taking pain medications.  Activity:  No heavy lifting (> 10 lbs), nothing in vagina (no tampons, douching, or intercourse) x 1 weeks; no tub baths for 1 weeks Vaginal spotting is expected but if your bleeding is heavy, period like,  please call the office   Diet:  You may return to your regular diet.  Do not eat large meals.  Eat small frequent meals throughout the day.  Continue to drink a good amount of water at least 6-8 glasses of water per day, hydration is very important for the healing process.  Pain Management: Take Motrin and/or Tylenol as needed for pain.  Alcohol -- Avoid for 24 hours and while taking pain medications.  Nausea: Take sips of ginger ale or soda  Fever -- Call physician if temperature over 101 degrees  Follow up:  If you experience fever (a temperature greater than 100.4), pain unrelieved by pain medication, shortness of breath, swelling of a single leg, or any other symptoms which are concerning to you please the office immediately.

## 2020-10-31 NOTE — Anesthesia Postprocedure Evaluation (Signed)
Anesthesia Post Note  Patient: Ann York  Procedure(s) Performed: DIAGNOSTIC HYSTEROSCOPY  Patient location during evaluation: Phase II Anesthesia Type: General Level of consciousness: awake Pain management: pain level controlled Vital Signs Assessment: post-procedure vital signs reviewed and stable Respiratory status: spontaneous breathing and respiratory function stable Cardiovascular status: blood pressure returned to baseline and stable Postop Assessment: no headache and no apparent nausea or vomiting Anesthetic complications: no Comments: Late entry   No notable events documented.   Last Vitals:  Vitals:   10/31/20 1015 10/31/20 1025  BP: 123/82 124/61  Pulse: 66 67  Resp: 16 18  Temp:  36.6 C  SpO2: 98% 99%    Last Pain:  Vitals:   10/31/20 1025  TempSrc: Oral  PainSc: 0-No pain                 Louann Sjogren

## 2020-10-31 NOTE — Transfer of Care (Signed)
Immediate Anesthesia Transfer of Care Note  Patient: Ann York  Procedure(s) Performed: DIAGNOSTIC HYSTEROSCOPY  Patient Location: PACU  Anesthesia Type:General  Level of Consciousness: awake, alert , oriented and patient cooperative  Airway & Oxygen Therapy: Patient Spontanous Breathing and Patient connected to nasal cannula oxygen  Post-op Assessment: Report given to RN  Post vital signs: Reviewed and stable  Last Vitals:  Vitals Value Taken Time  BP    Temp    Pulse    Resp    SpO2      Last Pain:  Vitals:   10/31/20 0751  TempSrc: Oral  PainSc: 0-No pain         Complications: No notable events documented.

## 2020-10-31 NOTE — Progress Notes (Signed)
Please Excuse Ann York from work today October 31, 2020. She cannot drive, operate machinery, sign legal documents for 24 hours, she may not lift >10lbs until after Tuesday November 07, 2020.  She may return to work Friday November 03, 2020 with no lifting >10lbs until after Tuesday November 07, 2020.

## 2020-10-31 NOTE — Anesthesia Preprocedure Evaluation (Signed)
Anesthesia Evaluation  Patient identified by MRN, date of birth, ID band Patient awake    Reviewed: Allergy & Precautions, H&P , NPO status , Patient's Chart, lab work & pertinent test results, reviewed documented beta blocker date and time   Airway Mallampati: II  TM Distance: >3 FB Neck ROM: full    Dental no notable dental hx.    Pulmonary neg pulmonary ROS, former smoker,    Pulmonary exam normal breath sounds clear to auscultation       Cardiovascular Exercise Tolerance: Good + CAD   Rhythm:regular Rate:Normal     Neuro/Psych PSYCHIATRIC DISORDERS Anxiety Depression negative neurological ROS     GI/Hepatic Neg liver ROS, GERD  Medicated,  Endo/Other  Hypothyroidism   Renal/GU negative Renal ROS  negative genitourinary   Musculoskeletal   Abdominal   Peds  Hematology negative hematology ROS (+)   Anesthesia Other Findings   Reproductive/Obstetrics negative OB ROS                             Anesthesia Physical Anesthesia Plan  ASA: 2  Anesthesia Plan: General LMA   Post-op Pain Management:    Induction:   PONV Risk Score and Plan: Propofol infusion  Airway Management Planned:   Additional Equipment:   Intra-op Plan:   Post-operative Plan:   Informed Consent: I have reviewed the patients History and Physical, chart, labs and discussed the procedure including the risks, benefits and alternatives for the proposed anesthesia with the patient or authorized representative who has indicated his/her understanding and acceptance.     Dental Advisory Given  Plan Discussed with: CRNA  Anesthesia Plan Comments:         Anesthesia Quick Evaluation

## 2020-11-02 ENCOUNTER — Encounter (HOSPITAL_COMMUNITY): Payer: Self-pay | Admitting: Obstetrics & Gynecology

## 2020-11-02 ENCOUNTER — Ambulatory Visit: Payer: BC Managed Care – PPO | Admitting: Orthopedic Surgery

## 2020-11-07 ENCOUNTER — Encounter: Payer: BC Managed Care – PPO | Admitting: Obstetrics & Gynecology

## 2020-11-10 ENCOUNTER — Encounter (HOSPITAL_COMMUNITY): Payer: Self-pay

## 2020-11-10 ENCOUNTER — Other Ambulatory Visit: Payer: Self-pay

## 2020-11-10 ENCOUNTER — Ambulatory Visit (HOSPITAL_COMMUNITY): Payer: BC Managed Care – PPO | Admitting: Anesthesiology

## 2020-11-10 ENCOUNTER — Encounter (HOSPITAL_COMMUNITY)
Admission: RE | Disposition: A | Discharge status: Home / Self Care | Payer: Self-pay | Source: Home / Self Care | Attending: Internal Medicine

## 2020-11-10 ENCOUNTER — Ambulatory Visit (HOSPITAL_COMMUNITY)
Admission: RE | Admit: 2020-11-10 | Discharge: 2020-11-10 | Disposition: A | Discharge status: Home / Self Care | Payer: BC Managed Care – PPO | Attending: Internal Medicine | Admitting: Internal Medicine

## 2020-11-10 DIAGNOSIS — Z8261 Family history of arthritis: Secondary | ICD-10-CM | POA: Insufficient documentation

## 2020-11-10 DIAGNOSIS — Z1211 Encounter for screening for malignant neoplasm of colon: Secondary | ICD-10-CM

## 2020-11-10 DIAGNOSIS — Z823 Family history of stroke: Secondary | ICD-10-CM | POA: Diagnosis not present

## 2020-11-10 DIAGNOSIS — Z8249 Family history of ischemic heart disease and other diseases of the circulatory system: Secondary | ICD-10-CM | POA: Insufficient documentation

## 2020-11-10 DIAGNOSIS — Z87891 Personal history of nicotine dependence: Secondary | ICD-10-CM | POA: Diagnosis not present

## 2020-11-10 DIAGNOSIS — K648 Other hemorrhoids: Secondary | ICD-10-CM | POA: Insufficient documentation

## 2020-11-10 DIAGNOSIS — Z79899 Other long term (current) drug therapy: Secondary | ICD-10-CM | POA: Diagnosis not present

## 2020-11-10 DIAGNOSIS — D123 Benign neoplasm of transverse colon: Secondary | ICD-10-CM | POA: Diagnosis not present

## 2020-11-10 DIAGNOSIS — K635 Polyp of colon: Secondary | ICD-10-CM | POA: Diagnosis not present

## 2020-11-10 DIAGNOSIS — K219 Gastro-esophageal reflux disease without esophagitis: Secondary | ICD-10-CM | POA: Diagnosis not present

## 2020-11-10 DIAGNOSIS — D122 Benign neoplasm of ascending colon: Secondary | ICD-10-CM | POA: Diagnosis not present

## 2020-11-10 HISTORY — PX: POLYPECTOMY: SHX5525

## 2020-11-10 HISTORY — PX: COLONOSCOPY WITH PROPOFOL: SHX5780

## 2020-11-10 HISTORY — DX: Other specified postprocedural states: Z98.890

## 2020-11-10 HISTORY — DX: Other specified postprocedural states: R11.2

## 2020-11-10 SURGERY — COLONOSCOPY WITH PROPOFOL
Anesthesia: General

## 2020-11-10 MED ORDER — PROPOFOL 500 MG/50ML IV EMUL
INTRAVENOUS | Status: DC | PRN
Start: 2020-11-10 — End: 2020-11-10
  Administered 2020-11-10: 125 ug/kg/min via INTRAVENOUS
  Administered 2020-11-10: 100 mg via INTRAVENOUS

## 2020-11-10 MED ORDER — ONDANSETRON HCL 4 MG/2ML IJ SOLN
4.0000 mg | Freq: Once | INTRAMUSCULAR | Status: AC
  Administered 2020-11-10: 4 mg via INTRAVENOUS

## 2020-11-10 MED ORDER — LIDOCAINE HCL (CARDIAC) PF 100 MG/5ML IV SOSY
PREFILLED_SYRINGE | INTRAVENOUS | Status: DC | PRN
  Administered 2020-11-10: 80 mg via INTRAVENOUS

## 2020-11-10 MED ORDER — ONDANSETRON HCL 4 MG/2ML IJ SOLN
INTRAMUSCULAR | Status: AC
  Filled 2020-11-10: qty 2

## 2020-11-10 MED ORDER — LACTATED RINGERS IV SOLN
INTRAVENOUS | Status: DC
  Administered 2020-11-10: 1000 mL via INTRAVENOUS

## 2020-11-10 NOTE — Interval H&P Note (Signed)
History and Physical Interval Note:  11/10/2020 7:35 AM  Ann York  has presented today for surgery, with the diagnosis of screening.  The various methods of treatment have been discussed with the patient and family. After consideration of risks, benefits and other options for treatment, the patient has consented to  Procedure(s) with comments: COLONOSCOPY WITH PROPOFOL (N/A) - 8:45am as a surgical intervention.  The patient's history has been reviewed, patient examined, no change in status, stable for surgery.  I have reviewed the patient's chart and labs.  Questions were answered to the patient's satisfaction.     [name]

## 2020-11-10 NOTE — Anesthesia Preprocedure Evaluation (Addendum)
Anesthesia Evaluation  Patient identified by MRN, date of birth, ID band Patient awake    Reviewed: Allergy & Precautions, NPO status , Patient's Chart, lab work & pertinent test results  History of Anesthesia Complications (+) PONV  Airway Mallampati: III  TM Distance: >3 FB   Mouth opening: Limited Mouth Opening  Dental  (+) Dental Advisory Given, Chipped,    Pulmonary former smoker,    Pulmonary exam normal breath sounds clear to auscultation       Cardiovascular Exercise Tolerance: Good + CAD  Normal cardiovascular exam Rhythm:Regular Rate:Normal     Neuro/Psych PSYCHIATRIC DISORDERS Anxiety Depression    GI/Hepatic GERD  Medicated and Controlled,  Endo/Other  Hypothyroidism   Renal/GU      Musculoskeletal  (+) Arthritis ,   Abdominal   Peds  Hematology   Anesthesia Other Findings   Reproductive/Obstetrics                            Anesthesia Physical Anesthesia Plan  ASA: 2  Anesthesia Plan: General   Post-op Pain Management:    Induction: Intravenous  PONV Risk Score and Plan: Propofol infusion  Airway Management Planned: Nasal Cannula and Natural Airway  Additional Equipment:   Intra-op Plan:   Post-operative Plan:   Informed Consent: I have reviewed the patients History and Physical, chart, labs and discussed the procedure including the risks, benefits and alternatives for the proposed anesthesia with the patient or authorized representative who has indicated his/her understanding and acceptance.     Dental advisory given  Plan Discussed with: CRNA and Surgeon  Anesthesia Plan Comments:         Anesthesia Quick Evaluation

## 2020-11-10 NOTE — Op Note (Signed)
[facility] Patient Name: Ann York Procedure Date: 11/10/2020 8:00 AM MRN: [id number] Date of Birth: [birth year] Attending MD: Elon Alas. Edgar Frisk CSN: [id number] Age: 55 Admit Type: Outpatient Procedure:                Colonoscopy Indications:              Screening for colorectal malignant neoplasm Providers:                Elon Alas. Storie Heffern, DO, Otis Peak B. Sharon Seller, RN,                            Raphael Gibney, Technician Referring MD:              Medicines:                See the Anesthesia note for documentation of the                            administered medications Complications:            No immediate complications. Estimated Blood Loss:     Estimated blood loss was minimal. Procedure:                Pre-Anesthesia Assessment:                           - The anesthesia plan was to use monitored                            anesthesia care (MAC).                           After obtaining informed consent, the colonoscope                            was passed under direct vision. Throughout the                            procedure, the patient's blood pressure, pulse, and                            oxygen saturations were monitored continuously. The                            [id number]-HQ190L ([id number]) scope was introduced through                            the anus and advanced to the the cecum, identified                            by appendiceal orifice and ileocecal valve. The                            colonoscopy was performed without difficulty. The                            patient tolerated the procedure  well. The quality                            of the bowel preparation was evaluated using the                            BBPS [facility] Bowel Preparation Scale) with scores                            of: Right Colon = 3, Transverse Colon = 3 and Left                            Colon = 3 (entire mucosa seen well with no residual                            staining,  small fragments of stool or opaque                            liquid). The total BBPS score equals 9. Scope In: 8:04:12 AM Scope Out: 8:15:04 AM Scope Withdrawal Time: 0 hours 7 minutes 17 seconds  Total Procedure Duration: 0 hours 10 minutes 52 seconds  Findings:      The perianal and digital rectal examinations were normal.      Non-bleeding internal hemorrhoids were found during endoscopy.      A 9 mm polyp was found in the ascending colon. The polyp was sessile.       The polyp was removed with a cold snare. Resection and retrieval were       complete.      A 5 mm polyp was found in the transverse colon. The polyp was sessile.       The polyp was removed with a cold snare. Resection and retrieval were       complete. Impression:               - Non-bleeding internal hemorrhoids.                           - One 9 mm polyp in the ascending colon, removed                            with a cold snare. Resected and retrieved.                           - One 5 mm polyp in the transverse colon, removed                            with a cold snare. Resected and retrieved. Moderate Sedation:      Per Anesthesia Care Recommendation:           - Patient has a contact number available for                            emergencies. The signs and symptoms of potential  delayed complications were discussed with the                            patient. Return to normal activities tomorrow.                            Written discharge instructions were provided to the                            patient.                           - Resume previous diet.                           - Continue present medications.                           - Await pathology results.                           - Repeat colonoscopy in 5 years for surveillance.                           - Return to GI clinic PRN. Procedure Code(s):        --- Professional ---                           [phone],  Colonoscopy, flexible; with removal of                            tumor(s), polyp(s), or other lesion(s) by snare                            technique Diagnosis Code(s):        --- Professional ---                           Z12.11, Encounter for screening for malignant                            neoplasm of colon                           K63.5, Polyp of colon                           K64.8, Other hemorrhoids CPT copyright 2019 American Medical Association. All rights reserved. The codes documented in this report are preliminary and upon coder review may  be revised to meet current compliance requirements. Elon Alas. [name], DO [name], DO 11/10/2020 8:20:06 AM This report has been signed electronically. Number of Addenda: 0

## 2020-11-10 NOTE — Discharge Instructions (Signed)
  Colonoscopy Discharge Instructions  Read the instructions outlined below and refer to this sheet in the next few weeks. These discharge instructions provide you with general information on caring for yourself after you leave the hospital. Your doctor may also give you specific instructions. While your treatment has been planned according to the most current medical practices available, unavoidable complications occasionally occur.   ACTIVITY You may resume your regular activity, but move at a slower pace for the next 24 hours.  Take frequent rest periods for the next 24 hours.  Walking will help get rid of the air and reduce the bloated feeling in your belly (abdomen).  No driving for 24 hours (because of the medicine (anesthesia) used during the test).   Do not sign any important legal documents or operate any machinery for 24 hours (because of the anesthesia used during the test).  NUTRITION Drink plenty of fluids.  You may resume your normal diet as instructed by your doctor.  Begin with a light meal and progress to your normal diet. Heavy or fried foods are harder to digest and may make you feel sick to your stomach (nauseated).  Avoid alcoholic beverages for 24 hours or as instructed.  MEDICATIONS You may resume your normal medications unless your doctor tells you otherwise.  WHAT YOU CAN EXPECT TODAY Some feelings of bloating in the abdomen.  Passage of more gas than usual.  Spotting of blood in your stool or on the toilet paper.  IF YOU HAD POLYPS REMOVED DURING THE COLONOSCOPY: No aspirin products for 7 days or as instructed.  No alcohol for 7 days or as instructed.  Eat a soft diet for the next 24 hours.  FINDING OUT THE RESULTS OF YOUR TEST Not all test results are available during your visit. If your test results are not back during the visit, make an appointment with your caregiver to find out the results. Do not assume everything is normal if you have not heard from your  caregiver or the medical facility. It is important for you to follow up on all of your test results.  SEEK IMMEDIATE MEDICAL ATTENTION IF: You have more than a spotting of blood in your stool.  Your belly is swollen (abdominal distention).  You are nauseated or vomiting.  You have a temperature over 101.  You have abdominal pain or discomfort that is severe or gets worse throughout the day.   Your colonoscopy revealed 2 polyp(s) which I removed successfully. Await pathology results, my office will contact you. I recommend repeating colonoscopy in 5 years for surveillance purposes. Otherwise follow up as needed.    I hope you have a great rest of your week!  Altovise Wahler K. Deshanae Lindo, D.O. Gastroenterology and Hepatology [facility]  

## 2020-11-10 NOTE — Transfer of Care (Signed)
Immediate Anesthesia Transfer of Care Note  Patient: Ann York  Procedure(s) Performed: COLONOSCOPY WITH PROPOFOL POLYPECTOMY  Patient Location: Endoscopy Unit  Anesthesia Type:General  Level of Consciousness: drowsy, patient cooperative and responds to stimulation  Airway & Oxygen Therapy: Patient Spontanous Breathing  Post-op Assessment: Report given to RN, Post -op Vital signs reviewed and stable and Patient moving all extremities X 4  Post vital signs: Reviewed and stable  Last Vitals:  Vitals Value Taken Time  BP    Temp    Pulse    Resp    SpO2      Last Pain:  Vitals:   11/10/20 0800  TempSrc:   PainSc: 0-No pain      Patients Stated Pain Goal: 7 ([id number])  Complications: No notable events documented.

## 2020-11-10 NOTE — Anesthesia Postprocedure Evaluation (Signed)
Anesthesia Post Note  Patient: Ann York  Procedure(s) Performed: COLONOSCOPY WITH PROPOFOL POLYPECTOMY  Patient location during evaluation: Endoscopy Anesthesia Type: General Level of consciousness: awake and alert and oriented Pain management: pain level controlled Vital Signs Assessment: post-procedure vital signs reviewed and stable Respiratory status: spontaneous breathing and respiratory function stable Cardiovascular status: blood pressure returned to baseline and stable Postop Assessment: no apparent nausea or vomiting Anesthetic complications: no   No notable events documented.   Last Vitals:  Vitals:   11/10/20 0720 11/10/20 0821  BP: 134/72 104/68  Pulse: 79   Resp: 15 (!) 27  Temp: 36.9 C (!) 36.4 C  SpO2: 99% 100%    Last Pain:  Vitals:   11/10/20 0821  TempSrc: Oral  PainSc: 0-No pain                 Emiel Kielty C Doloris Servantes

## 2020-11-13 LAB — SURGICAL PATHOLOGY

## 2020-11-16 ENCOUNTER — Encounter: Payer: Self-pay | Admitting: Orthopedic Surgery

## 2020-11-16 ENCOUNTER — Other Ambulatory Visit: Payer: Self-pay

## 2020-11-16 ENCOUNTER — Ambulatory Visit: Payer: BC Managed Care – PPO | Admitting: Orthopedic Surgery

## 2020-11-16 DIAGNOSIS — M5416 Radiculopathy, lumbar region: Secondary | ICD-10-CM | POA: Diagnosis not present

## 2020-11-16 DIAGNOSIS — M545 Low back pain, unspecified: Secondary | ICD-10-CM | POA: Diagnosis not present

## 2020-11-16 DIAGNOSIS — M5410 Radiculopathy, site unspecified: Secondary | ICD-10-CM

## 2020-11-16 MED ORDER — GABAPENTIN 100 MG PO CAPS: 100.0000 mg | ORAL_CAPSULE | ORAL | 5 refills | Status: DC

## 2020-11-16 NOTE — Progress Notes (Signed)
  FOLLOW UP   Encounter Diagnoses  Name Primary?   Lumbar pain    Radicular leg pain      Chief Complaint  Patient presents with   Back Pain    Follow up/pt states back only hurts when she is assigned to stand at the door   Medication Refill    Needs Gabapentin refilled     Ann York has mild pain in the lower back on the left side and when she stands in 1 position will get some burning leg pain but has improved significantly taking gabapentin and Tylenol  On examination she is tender on the left side of the lower back but has a negative straight leg raise posture is reasonable  No neurologic deficits  Medication refill Meds ordered this encounter  Medications   gabapentin (NEURONTIN) 100 MG capsule    Sig: Take 1-2 capsules (100-200 mg total) by mouth See admin instructions. Take 100 mg in the morning and 200 mg at night    Dispense:  60 capsule    Refill:  5    Continue medication follow-up as needed  Chronic problem is stable prescription drug management no other data

## 2020-11-17 ENCOUNTER — Telehealth: Payer: Self-pay

## 2020-11-17 NOTE — Telephone Encounter (Signed)
Please NIC TCS for 5 years per surgical pathology result note.

## 2020-11-20 ENCOUNTER — Encounter (HOSPITAL_COMMUNITY): Payer: Self-pay | Admitting: Internal Medicine

## 2020-11-20 NOTE — Telephone Encounter (Signed)
Rx sent in for preop

## 2020-11-22 ENCOUNTER — Encounter: Payer: BC Managed Care – PPO | Admitting: Obstetrics & Gynecology

## 2020-12-28 ENCOUNTER — Other Ambulatory Visit (HOSPITAL_COMMUNITY): Payer: Self-pay | Admitting: Family Medicine

## 2020-12-28 DIAGNOSIS — Z1231 Encounter for screening mammogram for malignant neoplasm of breast: Secondary | ICD-10-CM

## 2021-01-29 ENCOUNTER — Ambulatory Visit (HOSPITAL_COMMUNITY)
Admission: RE | Admit: 2021-01-29 | Discharge: 2021-01-29 | Disposition: A | Discharge status: Home / Self Care | Payer: BC Managed Care – PPO | Source: Ambulatory Visit | Attending: Family Medicine | Admitting: Family Medicine

## 2021-01-29 ENCOUNTER — Other Ambulatory Visit: Payer: Self-pay

## 2021-01-29 DIAGNOSIS — Z1231 Encounter for screening mammogram for malignant neoplasm of breast: Secondary | ICD-10-CM | POA: Insufficient documentation

## 2021-04-16 ENCOUNTER — Ambulatory Visit: Payer: BC Managed Care – PPO | Admitting: Internal Medicine

## 2021-04-18 ENCOUNTER — Ambulatory Visit: Payer: BC Managed Care – PPO | Admitting: Internal Medicine

## 2021-05-04 ENCOUNTER — Telehealth: Payer: Self-pay | Admitting: Radiology

## 2021-05-04 NOTE — Telephone Encounter (Signed)
Patient says that the gabapentin is really helping her.  She just filled her last refill.  She does not have a follow up appt scheduled yet.  Wants to know if she needs a f/u appt for you to re evaluate prior to giving her more refills?  Or is she can just call the office when refill is needed.  Please advise.

## 2021-05-09 NOTE — Telephone Encounter (Signed)
I called her to let her know that she can pharmacy to send refill request and we should see her once a year. She is due in August. She voiced understanding.

## 2021-05-14 ENCOUNTER — Other Ambulatory Visit (INDEPENDENT_AMBULATORY_CARE_PROVIDER_SITE_OTHER): Payer: BC Managed Care – PPO

## 2021-05-14 ENCOUNTER — Other Ambulatory Visit: Payer: Self-pay | Admitting: Adult Health

## 2021-05-14 ENCOUNTER — Other Ambulatory Visit: Payer: Self-pay

## 2021-05-14 DIAGNOSIS — R399 Unspecified symptoms and signs involving the genitourinary system: Secondary | ICD-10-CM

## 2021-05-14 LAB — POCT URINALYSIS DIPSTICK OB
Glucose, UA: NEGATIVE
Ketones, UA: NEGATIVE
Nitrite, UA: NEGATIVE
POC,PROTEIN,UA: NEGATIVE

## 2021-05-14 MED ORDER — CIPROFLOXACIN HCL 500 MG PO TABS: 500.0000 mg | ORAL_TABLET | Freq: Two times a day (BID) | ORAL | 0 refills | Status: DC

## 2021-05-14 NOTE — Progress Notes (Signed)
° °  NURSE VISIT- UTI SYMPTOMS   SUBJECTIVE:  Ann York is a 56 y.o. [phone] female here for UTI symptoms. She is a GYN patient. She reports flank pain bilaterally, hematuria, lower abdominal pain, urinary frequency, and burning with urination .  OBJECTIVE:  There were no vitals taken for this visit.  Appears well, in no apparent distress  Results for orders placed or performed in visit on 05/14/21 (from the past 24 hour(s))  POC Urinalysis Dipstick OB   Collection Time: 05/14/21  2:22 PM  Result Value Ref Range   Color, UA     Clarity, UA     Glucose, UA Negative Negative   Bilirubin, UA     Ketones, UA neg    Spec Grav, UA     Blood, UA small    pH, UA     POC,PROTEIN,UA Negative Negative, Trace, Small (1+), Moderate (2+), Large (3+), 4+   Urobilinogen, UA     Nitrite, UA neg    Leukocytes, UA Moderate (2+) (A) Negative   Appearance     Odor      ASSESSMENT: GYN patient with UTI symptoms and negative nitrites  PLAN: Discussed with Ann York, AGNP   Rx sent by provider today: Yes Urine culture sent Call or return to clinic prn if these symptoms worsen or fail to improve as anticipated. Follow-up: as needed   Ann York  05/14/2021 2:24 PM

## 2021-05-14 NOTE — Progress Notes (Signed)
Will rx cipro

## 2021-05-16 ENCOUNTER — Ambulatory Visit: Payer: BC Managed Care – PPO | Admitting: Internal Medicine

## 2021-05-16 LAB — URINE CULTURE: Organism ID, Bacteria: NO GROWTH

## 2021-05-20 NOTE — Progress Notes (Deleted)
Office Visit Note  Patient: Ann York             Date of Birth: 1966/02/22           MRN: [id number]             PCP: Bretta Bang, MD Referring: Wandra Scot Pru* Visit Date: 05/21/2021 Occupation: @GUAROCC @  Subjective:  No chief complaint on file.   History of Present Illness: Ann York is a 56 y.o. female here for evaluation of subcutaneous rheumatoid nodules. She has existing osteoarthritis taking gabapentin and meloxicam with partial improvement.***   Activities of Daily Living:  Patient reports morning stiffness for *** {minute/hour:19697}.   Patient {ACTIONS;DENIES/REPORTS:21021675::"Denies"} nocturnal pain.  Difficulty dressing/grooming: {ACTIONS;DENIES/REPORTS:21021675::"Denies"} Difficulty climbing stairs: {ACTIONS;DENIES/REPORTS:21021675::"Denies"} Difficulty getting out of chair: {ACTIONS;DENIES/REPORTS:21021675::"Denies"} Difficulty using hands for taps, buttons, cutlery, and/or writing: {ACTIONS;DENIES/REPORTS:21021675::"Denies"}  No Rheumatology ROS completed.   PMFS History:  Patient Active Problem List   Diagnosis Date Noted   Elevated liver enzymes 06/15/2020   Hypothyroidism 06/15/2020   Moderate major depression, single episode ([place]) 06/15/2020   Morbid obesity ([place]) 06/15/2020   Vitamin D deficiency 06/15/2020   Dyslipidemia 06/15/2020   Anxiety 06/15/2020   GERD 02/01/2010   DYSPEPSIA 02/01/2010   HAND PAIN 06/23/2008    Past Medical History:  Diagnosis Date   Anxiety    Arthritis    CAD (coronary artery disease)    Chest pain    GERD (gastroesophageal reflux disease)    Hyperlipidemia    Hypothyroidism    Panic attacks    PONV (postoperative nausea and vomiting)    Thyroid disease     Family History  Problem Relation Age of Onset   Rheum arthritis Mother    Hypertension Mother    Thyroid disease Mother    Stroke Mother    Hypertension Father    Heart disease Father    Hypertension Sister    Thyroid disease  Sister    Hypertension Brother    Past Surgical History:  Procedure Laterality Date   BREAST SURGERY     biopsy left breast   CESAREAN SECTION     x 2   COLONOSCOPY WITH PROPOFOL N/A 11/10/2020   Procedure: COLONOSCOPY WITH PROPOFOL;  Surgeon: [name], DO;  Location: [facility];  Service: Endoscopy;  Laterality: N/A;  8:45am   ENDOMETRIAL ABLATION     HYSTEROSCOPY  10/31/2020   Procedure: DIAGNOSTIC HYSTEROSCOPY;  Surgeon: Janyth Pupa, DO;  Location: [facility];  Service: Gynecology;;   POLYPECTOMY  11/10/2020   Procedure: POLYPECTOMY;  Surgeon: [name], DO;  Location: [facility];  Service: Endoscopy;;   TUBAL LIGATION     Social History   Social History Narrative   Not on file   Immunization History  Administered Date(s) Administered   Influenza Split 02/04/2015     Objective: Vital Signs: There were no vitals taken for this visit.   Physical Exam   Musculoskeletal Exam: ***  CDAI Exam: CDAI Score: -- Patient Global: --; Provider Global: -- Swollen: --; Tender: -- Joint Exam 05/21/2021   No joint exam has been documented for this visit   There is currently no information documented on the homunculus. Go to the Rheumatology activity and complete the homunculus joint exam.  Investigation: No additional findings.  Imaging: No results found.  Recent Labs: Lab Results  Component Value Date   WBC 4.0 10/26/2020   HGB 13.0 10/26/2020   PLT 209 10/26/2020   NA 137  10/26/2020   K 3.9 10/26/2020   CL 103 10/26/2020   CO2 24 10/26/2020   GLUCOSE 83 10/26/2020   BUN 18 10/26/2020   CREATININE 0.59 10/26/2020   BILITOT 0.5 01/26/2019   ALKPHOS 74 01/26/2019   AST 26 01/26/2019   ALT 55 (H) 01/26/2019   PROT 7.5 01/26/2019   ALBUMIN 4.0 01/26/2019   CALCIUM 9.0 10/26/2020   GFRAA >60 01/26/2019    Speciality Comments: No specialty comments available.  Procedures:  No procedures performed Allergies: Septra  [sulfamethoxazole-trimethoprim] and Sulfa antibiotics   Assessment / Plan:     Visit Diagnoses: No diagnosis found.  Orders: No orders of the defined types were placed in this encounter.  No orders of the defined types were placed in this encounter.   Face-to-face time spent with patient was *** minutes. Greater than 50% of time was spent in counseling and coordination of care.  Follow-Up Instructions: No follow-ups on file.   Collier Salina, MD  Note - This record has been created using [employer].  Chart creation errors have been sought, but may not always  have been located. Such creation errors do not reflect on  the standard of medical care.

## 2021-05-21 ENCOUNTER — Ambulatory Visit: Payer: BC Managed Care – PPO | Admitting: Internal Medicine

## 2021-06-01 ENCOUNTER — Other Ambulatory Visit: Payer: Self-pay | Admitting: Orthopedic Surgery

## 2021-06-01 DIAGNOSIS — M545 Low back pain, unspecified: Secondary | ICD-10-CM

## 2021-06-01 DIAGNOSIS — M5410 Radiculopathy, site unspecified: Secondary | ICD-10-CM

## 2021-06-13 ENCOUNTER — Ambulatory Visit: Payer: BC Managed Care – PPO | Admitting: Internal Medicine

## 2021-07-04 ENCOUNTER — Other Ambulatory Visit: Payer: Self-pay | Admitting: Orthopedic Surgery

## 2021-07-04 DIAGNOSIS — M545 Low back pain, unspecified: Secondary | ICD-10-CM

## 2021-07-04 DIAGNOSIS — M5410 Radiculopathy, site unspecified: Secondary | ICD-10-CM

## 2021-07-04 NOTE — Telephone Encounter (Signed)
Patient called for refill *asking if she may have this medication ordered with some refills*l ?gabapentin (NEURONTIN) 100 MG capsule 60 capsule  ?     [name], [name] ?

## 2021-07-05 ENCOUNTER — Telehealth: Payer: Self-pay | Admitting: Orthopedic Surgery

## 2021-07-05 MED ORDER — GABAPENTIN 100 MG PO CAPS: ORAL_CAPSULE | ORAL | 5 refills | Status: DC

## 2021-07-05 NOTE — Telephone Encounter (Signed)
Refill request sent to provider.

## 2021-07-05 NOTE — Telephone Encounter (Signed)
Patient called to ask if her form from [facility] for intermittent FMLA has been received. Please advise. Patient states her note would be the same estimated times for the form to be filled out. ?

## 2021-07-17 ENCOUNTER — Telehealth: Payer: Self-pay

## 2021-07-17 NOTE — Telephone Encounter (Signed)
Patient left message on voicemail asking for a return call. She has questions about her intermittent papers.  ?Please call [phone]. ?

## 2021-07-26 LAB — TSH: TSH: 4.51 (ref 0.41–5.90)

## 2021-07-26 LAB — VITAMIN D 25 HYDROXY (VIT D DEFICIENCY, FRACTURES): Vit D, 25-Hydroxy: 47

## 2021-07-26 LAB — LIPID PANEL
LDL Cholesterol: 168
Triglycerides: 247 — AB (ref 40–160)

## 2021-07-26 LAB — HEMOGLOBIN A1C: Hemoglobin A1C: 5.3

## 2021-07-26 LAB — COMPREHENSIVE METABOLIC PANEL: eGFR: 104

## 2021-09-06 LAB — TSH: TSH: 2.62 (ref 0.41–5.90)

## 2021-09-11 ENCOUNTER — Encounter: Payer: Self-pay | Admitting: Women's Health

## 2021-09-11 ENCOUNTER — Ambulatory Visit (INDEPENDENT_AMBULATORY_CARE_PROVIDER_SITE_OTHER): Payer: BC Managed Care – PPO | Admitting: Women's Health

## 2021-09-11 VITALS — BP 117/78 | HR 84 | Wt 173.0 lb

## 2021-09-11 DIAGNOSIS — Z01419 Encounter for gynecological examination (general) (routine) without abnormal findings: Secondary | ICD-10-CM

## 2021-09-11 NOTE — Progress Notes (Signed)
WELL-WOMAN EXAMINATION Patient name: Ann York MRN [id number]  Date of birth: 06-25-1965 Chief Complaint:   Gynecologic Exam  History of Present Illness:   Ann York is a 56 y.o. [phone] Caucasian female being seen today for a routine well-woman exam.  Current complaints: feels like skin tag may be coming back on Lt labia (removed 07/11/20, path showed skin tag w/ features of condyloma, no h/o she's aware of). Broke up w/ her boyfriend of 7 years. No further vaginal bleeding since last May.   PCP: does have      does not desire labs No LMP recorded. Patient has had an ablation. The current method of family planning is abstinence, tubal ligation, and endometrial ablation, postmenopausal .  Last pap 07/11/20. Results were: NILM w/ HRHPV negative. H/O abnormal pap: yes 2019 LSIL w/ -HRHPV Last mammogram: 01/29/21. Results were: normal. Family h/o breast cancer: no Last colonoscopy: 11/10/20. Results were: abnormal tubular adenoma polyp x 2- repeat TCS in 55yr . Family h/o colorectal cancer: no     07/11/2020    2:33 PM 11/13/2017    8:51 AM  Depression screen PHQ 2/9  Decreased Interest 0 0  Down, Depressed, Hopeless 0 0  PHQ - 2 Score 0 0  Altered sleeping 2   Tired, decreased energy 2   Change in appetite 0   Feeling bad or failure about yourself  0   Trouble concentrating 0   Moving slowly or fidgety/restless 0   Suicidal thoughts 0   PHQ-9 Score 4         07/11/2020    2:33 PM  GAD 7 : Generalized Anxiety Score  Nervous, Anxious, on Edge 3  Control/stop worrying 1  Worry too much - different things 1  Trouble relaxing 2  Restless 0  Easily annoyed or irritable 2  Afraid - awful might happen 0  Total GAD 7 Score 9     Review of Systems:   Pertinent items are noted in HPI Denies any headaches, blurred vision, fatigue, shortness of breath, chest pain, abdominal pain, abnormal vaginal discharge/itching/odor/irritation, problems with periods, bowel movements, urination, or  intercourse unless otherwise stated above. Pertinent History Reviewed:  Reviewed past medical,surgical, social and family history.  Reviewed problem list, medications and allergies. Physical Assessment:   Vitals:   09/11/21 1446  BP: 117/78  Pulse: 84  Weight: 173 lb (78.5 kg)  Body mass index is 32.69 kg/m.        Physical Examination:   General appearance - well appearing, and in no distress  Mental status - alert, oriented to person, place, and time  Psych:  She has a normal mood and affect  Skin - warm and dry, normal color, no suspicious lesions noted  Chest - effort normal, all lung fields clear to auscultation bilaterally  Heart - normal rate and regular rhythm  Neck:  midline trachea, no thyromegaly or nodules  Breasts - breasts appear normal, no suspicious masses, no skin or nipple changes or  axillary nodes  Abdomen - soft, nontender, nondistended, no masses or organomegaly  Pelvic - VULVA: normal appearing vulva with no tenderness, small subdermal cyst Lt labia majora (area she thought was skin tag returning) VAGINA: normal appearing vagina with normal color and discharge, no lesions  CERVIX: normal appearing cervix without discharge or lesions, no CMT  Thin prep pap is not done   UTERUS: uterus is felt to be normal size, shape, consistency and nontender   ADNEXA: No adnexal  masses or tenderness noted.  Extremities:  No swelling or varicosities noted  Chaperone:  Zwaye Banton, CMA     No results found for this or any previous visit (from the past 24 hour(s)).  Assessment & Plan:  1) Well-Woman Exam  2) H/O postmenopausal bleeding> May 2022 (postcoital), none since, U/S 08/31/20: multiple fibroids, endometrium not visualized d/t obscuring fibroids, had suboptimal endometrial biopsy in office, but path was w/o atypia or malignancy, had failed hysteroscopy in OR. Discussed if ever has any more bleeding to let us know.   Labs/procedures today: none  Mammogram:  in Oct ,  or sooner if problems Colonoscopy: per GI, or sooner if problems  No orders of the defined types were placed in this encounter.   Meds: No orders of the defined types were placed in this encounter.   Follow-up: Return in about 1 year (around 09/12/2022) for Physical.  [place], [facility] 09/11/2021 3:29 PM

## 2021-10-10 ENCOUNTER — Telehealth: Payer: Self-pay | Admitting: Orthopedic Surgery

## 2021-10-10 NOTE — Telephone Encounter (Signed)
Patient called to inquire about immediate appointment - said thinks it may be a collarbone fracture; states has not had any treatment yet, and has noticed for a couple of weeks. We have scheduled first available, and patient said she may go to urgent care, ED, or contact primary care provider in meantime.

## 2021-10-16 ENCOUNTER — Encounter: Payer: Self-pay | Admitting: Nurse Practitioner

## 2021-10-16 ENCOUNTER — Ambulatory Visit (INDEPENDENT_AMBULATORY_CARE_PROVIDER_SITE_OTHER): Payer: BC Managed Care – PPO | Admitting: Nurse Practitioner

## 2021-10-16 VITALS — BP 135/80 | HR 71 | Ht 61.0 in | Wt 172.0 lb

## 2021-10-16 DIAGNOSIS — Z6832 Body mass index (BMI) 32.0-32.9, adult: Secondary | ICD-10-CM | POA: Diagnosis not present

## 2021-10-16 DIAGNOSIS — E669 Obesity, unspecified: Secondary | ICD-10-CM

## 2021-10-16 DIAGNOSIS — E039 Hypothyroidism, unspecified: Secondary | ICD-10-CM | POA: Diagnosis not present

## 2021-10-16 NOTE — Progress Notes (Signed)
Endocrinology Consult Note                                         10/16/2021, 3:13 PM  Subjective:   Subjective    Ann York is a 56 y.o.-year-old female patient being seen in consultation for hypothyroidism referred by System, Provider Not In.   Past Medical History:  Diagnosis Date   Anxiety    Arthritis    CAD (coronary artery disease)    Chest pain    GERD (gastroesophageal reflux disease)    Hyperlipidemia    Hypothyroidism    Panic attacks    PONV (postoperative nausea and vomiting)    Thyroid disease     Past Surgical History:  Procedure Laterality Date   BREAST SURGERY     biopsy left breast   CESAREAN SECTION     x 2   COLONOSCOPY WITH PROPOFOL N/A 11/10/2020   Procedure: COLONOSCOPY WITH PROPOFOL;  Surgeon: [name], DO;  Location: [facility];  Service: Endoscopy;  Laterality: N/A;  8:45am   ENDOMETRIAL ABLATION     HYSTEROSCOPY  10/31/2020   Procedure: DIAGNOSTIC HYSTEROSCOPY;  Surgeon: Janyth Pupa, DO;  Location: [facility];  Service: Gynecology;;   POLYPECTOMY  11/10/2020   Procedure: POLYPECTOMY;  Surgeon: [name], DO;  Location: [facility];  Service: Endoscopy;;   TUBAL LIGATION      Social History   Socioeconomic History   Marital status: Divorced    Spouse name: Not on file   Number of children: Not on file   Years of education: Not on file   Highest education level: Not on file  Occupational History   Not on file  Tobacco Use   Smoking status: Former    Packs/day: 0.25    Years: 1.00    Total pack years: 0.25    Types: Cigarettes    Quit date: 10/18/1992    Years since quitting: 29.0   Smokeless tobacco: Never  Vaping Use   Vaping Use: Never used  Substance and Sexual Activity   Alcohol use: No   Drug use: No   Sexual activity: Yes    Birth control/protection: Surgical    Comment: tubal and ablation  Other Topics Concern   Not on file   Social History Narrative   Not on file   Social Determinants of Health   Financial Resource Strain: Low Risk  (07/11/2020)   Overall Financial Resource Strain (CARDIA)    Difficulty of Paying Living Expenses: Not hard at all  Food Insecurity: No Food Insecurity (07/11/2020)   Hunger Vital Sign    Worried About Running Out of Food in the Last Year: Never true    Ran Out of Food in the Last Year: Never true  Transportation Needs: No Transportation Needs (07/11/2020)   PRAPARE - Hydrologist (Medical): No    Lack of Transportation (Non-Medical): No  Physical Activity: Insufficiently Active (07/11/2020)   Exercise Vital Sign  Days of Exercise per Week: 2 days    Minutes of Exercise per Session: 10 min  Stress: Stress Concern Present (07/11/2020)   [place]    Feeling of Stress : Very much  Social Connections: Moderately Integrated (07/11/2020)   Social Connection and Isolation Panel [NHANES]    Frequency of Communication with Friends and Family: More than three times a week    Frequency of Social Gatherings with Friends and Family: Twice a week    Attends Religious Services: More than 4 times per year    Active Member of [employer] or Organizations: Yes    Attends Archivist Meetings: Never    Marital Status: Divorced    Family History  Problem Relation Age of Onset   Rheum arthritis Mother    Hypertension Mother    Thyroid disease Mother    Stroke Mother    Hypertension Father    Heart disease Father    Hypertension Sister    Thyroid disease Sister    Hypertension Brother     Outpatient Encounter Medications as of 10/16/2021  Medication Sig   acetaminophen (TYLENOL) 500 MG tablet Take 1,000 mg by mouth every 6 (six) hours as needed for mild pain or moderate pain.   ALPRAZolam (XANAX) 0.5 MG tablet Take 0.5 mg by mouth at bedtime as needed for anxiety.   cholecalciferol (VITAMIN  D3) 25 MCG (1000 UNIT) tablet Take 2,000 Units by mouth daily.   ciprofloxacin (CIPRO) 500 MG tablet Take 1 tablet (500 mg total) by mouth 2 (two) times daily.   famotidine (PEPCID) 40 MG tablet Take 40 mg by mouth daily.   gabapentin (NEURONTIN) 100 MG capsule TAKE 1 CAPSULE BY MOUTH IN THE MORNING, THEN TAKE 2 CAPSULES BY MOUTH AT NIGHT   polyethylene glycol-electrolytes (NULYTELY) 420 g solution As directed (Patient not taking: Reported on 09/11/2021)   thyroid (ARMOUR) 30 MG tablet Take 45 mg by mouth daily before breakfast. One 30 mg and 1 '15mg'$  qd   [DISCONTINUED] esomeprazole (NEXIUM) 40 MG capsule Take 40 mg by mouth daily before breakfast.     [DISCONTINUED] sertraline (ZOLOFT) 50 MG tablet Take 50 mg by mouth daily.     No facility-administered encounter medications on file as of 10/16/2021.    ALLERGIES: Allergies  Allergen Reactions   Septra [Sulfamethoxazole-Trimethoprim] Anaphylaxis   Sulfa Antibiotics Anaphylaxis   VACCINATION STATUS: Immunization History  Administered Date(s) Administered   Influenza Split 02/04/2015     HPI   Ann York is a patient with the above medical history. she was diagnosed with hypothyroidism at approximate age of 56 years, which required subsequent initiation of thyroid hormone replacement. she was given various doses of NP or Armour Thyroid over the years, currently on 45 mg micrograms. she reports compliance to this medication.  She does take her Pepcid about 30 minutes after taking her thyroid medication though and was also taking a Biotin supplement.    I reviewed patient's thyroid tests:  Lab Results  Component Value Date   TSH 2.62 09/06/2021   TSH 4.51 07/26/2021   TSH 7.452 (H) 01/26/2019     Pt describes: - inability to lose weight - fatigue - heat intolerance - intermittent constipation - dry skin - hair loss - tingling hands - intermittent palpitations (during dosage changes of NP Thyroid and Levothyroxine)  Pt  denies feeling nodules in neck, hoarseness, dysphagia/odynophagia, SOB with lying down.  she does have significant family history  of thyroid disorders in her mom, grandma, and sisters.  No family history of thyroid cancer. No history of radiation therapy to head or neck.  No recent use of iodine supplements.   I reviewed her chart and she also has a history of GERD, vitamin d deficiency, HLD, and anxiety.   ROS:  Constitutional: inability to lose weight, + fatigue, + subjective hyperthermia, no subjective hypothermia Eyes: no blurry vision, no xerophthalmia ENT: no sore throat, no nodules palpated in throat, no dysphagia/odynophagia, no hoarseness Cardiovascular: no chest pain, no SOB, + intermittent palpitations, no leg swelling Respiratory: no cough, no SOB Gastrointestinal: no nausea/vomiting/diarrhea, + intermittent constipation Musculoskeletal: no muscle/joint aches Skin: no rashes Neurological: no tremors, no numbness, no dizziness, + tingling to bilateral hands Psychiatric: no depression, no anxiety   Objective:   Objective     BP 135/80   Pulse 71   Ht '5\' 1"'$  (1.549 m)   Wt 172 lb (78 kg)   BMI 32.50 kg/m  Wt Readings from Last 3 Encounters:  10/16/21 172 lb (78 kg)  09/11/21 173 lb (78.5 kg)  11/16/20 173 lb 3.2 oz (78.6 kg)    BP Readings from Last 3 Encounters:  10/16/21 135/80  09/11/21 117/78  11/16/20 118/78     Constitutional:  Body mass index is 32.5 kg/m., not in acute distress, normal state of mind Eyes: PERRLA, EOMI, no exophthalmos ENT: moist mucous membranes, no thyromegaly, no cervical lymphadenopathy Cardiovascular: normal precordial activity, RRR, no murmur/rubs/gallops Respiratory:  adequate breathing efforts, no gross chest deformity, Clear to auscultation bilaterally Gastrointestinal: abdomen soft, non-tender, no distension, bowel sounds present Musculoskeletal: no gross deformities, strength intact in all four extremities Skin: moist,  warm, no rashes Neurological: no tremor with outstretched hands, deep tendon reflexes normal in BLE.   CMP ( most recent) CMP     Component Value Date/Time   NA 137 10/26/2020 1516   K 3.9 10/26/2020 1516   CL 103 10/26/2020 1516   CO2 24 10/26/2020 1516   GLUCOSE 83 10/26/2020 1516   BUN 18 10/26/2020 1516   CREATININE 0.59 10/26/2020 1516   CALCIUM 9.0 10/26/2020 1516   PROT 7.5 01/26/2019 1438   ALBUMIN 4.0 01/26/2019 1438   AST 26 01/26/2019 1438   ALT 55 (H) 01/26/2019 1438   ALKPHOS 74 01/26/2019 1438   BILITOT 0.5 01/26/2019 1438   GFRNONAA >60 10/26/2020 1516   GFRAA >60 01/26/2019 1438     Diabetic Labs (most recent): Lab Results  Component Value Date   HGBA1C 5.3 07/26/2021     Lipid Panel ( most recent) Lipid Panel     Component Value Date/Time   TRIG 247 (A) 07/26/2021 0000   LDLCALC 168 07/26/2021 0000       Lab Results  Component Value Date   TSH 2.62 09/06/2021   TSH 4.51 07/26/2021   TSH 7.452 (H) 01/26/2019    Thyroid US from 02/16/2019 CLINICAL DATA:  Hypothyroidism   EXAM: THYROID ULTRASOUND   TECHNIQUE: Ultrasound examination of the thyroid gland and adjacent soft tissues was performed.   COMPARISON:  None.   FINDINGS: Parenchymal Echotexture: Markedly heterogenous   Isthmus: 5 mm   Right lobe: 4.2 x 1.6 x 1.2 cm   Left lobe: 5.2 x 1.4 x 1.9 cm   _________________________________________________________   Estimated total number of nodules >/= 1 cm: 0   Number of spongiform nodules >/=  2 cm not described below (TR1): 0   Number of mixed cystic and solid  nodules >/= 1.5 cm not described below (TR2): 0   _________________________________________________________   Marked thyroid heterogeneity and background subcentimeter pseudo nodularity. No hypervascularity. No significant discrete nodule or focal abnormality. No regional adenopathy.   IMPRESSION: Marked thyroid heterogeneity, suspect sequelae from  prior thyroiditis. Otherwise no significant discrete nodule or focal abnormality.   The above is in keeping with the ACR TI-RADS recommendations - J Am Coll Radiol 2017;14:587-595.     Electronically Signed   By: Jerilynn Mages.  Shick M.D.   On: 02/16/2019 16:02  Assessment & Plan:   ASSESSMENT / PLAN:  1. 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 On physical exam, patient  does not have gross goiter, thyroid nodules, or neck compression symptoms.  She is advised to continue her current dose of NP thyroid 45 mg po daily but to start taking it properly as listed below.  - We discussed about correct intake of levothyroxine, at fasting, with water, separated by at least 30 minutes from breakfast, and separated by more than 4 hours from calcium, iron, multivitamins, acid reflux medications (PPIs). -Patient is made aware of the fact that thyroid hormone replacement is needed for life, dose to be adjusted by periodic monitoring of thyroid function tests.  - Will check thyroid tests before next visit: TSH, free T4, free T3, and antibody testing to help classify her dysfunction.      - Time spent with the patient: 45 minutes, of which >50% was spent in obtaining information about her symptoms, reviewing her previous labs, evaluations, and treatments, counseling her about her hypothyroidism, and developing a plan to confirm the diagnosis and long term treatment as necessary. Please refer to "Patient Self Inventory" in the Media tab for reviewed elements of pertinent patient history.  Ann York participated in the discussions, expressed understanding, and voiced agreement with the above plans.  All questions were answered to her satisfaction. she is encouraged to contact clinic should she have any questions or concerns prior to her return visit.   FOLLOW UP PLAN:  Return in about 5 weeks (around 11/20/2021) for Thyroid follow  up, Previsit labs.  Rayetta Pigg, [facility] [address] [place], [place] [address] Phone: [phone] Fax: [phone]  10/16/2021, 3:13 PM

## 2021-10-16 NOTE — Patient Instructions (Signed)

## 2021-10-29 ENCOUNTER — Ambulatory Visit: Payer: BC Managed Care – PPO | Admitting: Orthopedic Surgery

## 2021-11-15 LAB — THYROGLOBULIN ANTIBODY: Thyroglobulin Antibody: 63.2 IU/mL — ABNORMAL HIGH (ref 0.0–0.9)

## 2021-11-15 LAB — T4, FREE: Free T4: 0.8 ng/dL — ABNORMAL LOW (ref 0.82–1.77)

## 2021-11-15 LAB — THYROID PEROXIDASE ANTIBODY: Thyroperoxidase Ab SerPl-aCnc: 493 IU/mL — ABNORMAL HIGH (ref 0–34)

## 2021-11-15 LAB — TSH: TSH: 2.83 u[IU]/mL (ref 0.450–4.500)

## 2021-11-15 LAB — T3, FREE: T3, Free: 2.6 pg/mL (ref 2.0–4.4)

## 2021-11-20 ENCOUNTER — Ambulatory Visit: Payer: BC Managed Care – PPO | Admitting: Nurse Practitioner

## 2021-11-20 DIAGNOSIS — E039 Hypothyroidism, unspecified: Secondary | ICD-10-CM

## 2021-11-20 DIAGNOSIS — E669 Obesity, unspecified: Secondary | ICD-10-CM

## 2021-11-29 ENCOUNTER — Ambulatory Visit: Payer: BC Managed Care – PPO | Admitting: Nutrition

## 2021-12-17 ENCOUNTER — Other Ambulatory Visit (HOSPITAL_COMMUNITY): Payer: Self-pay | Admitting: Nurse Practitioner

## 2021-12-17 ENCOUNTER — Ambulatory Visit: Payer: BC Managed Care – PPO | Admitting: Orthopedic Surgery

## 2021-12-17 ENCOUNTER — Ambulatory Visit (HOSPITAL_COMMUNITY)
Admission: RE | Admit: 2021-12-17 | Discharge: 2021-12-17 | Disposition: A | Discharge status: Home / Self Care | Payer: BC Managed Care – PPO | Source: Ambulatory Visit | Attending: Orthopedic Surgery | Admitting: Orthopedic Surgery

## 2021-12-17 DIAGNOSIS — M545 Low back pain, unspecified: Secondary | ICD-10-CM

## 2021-12-17 DIAGNOSIS — M25511 Pain in right shoulder: Secondary | ICD-10-CM

## 2021-12-17 DIAGNOSIS — Z1231 Encounter for screening mammogram for malignant neoplasm of breast: Secondary | ICD-10-CM

## 2021-12-17 NOTE — Patient Instructions (Signed)
Continue gabapentin.

## 2021-12-17 NOTE — Progress Notes (Signed)
Routine follow-up patient has chronic lower back pain controlled with gabapentin  She is on 100 mg in the morning 2 at night  She tried to stop taking it and the leg numbness on the left side got worse   She restarted it and got good control.  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  Review of systems reveals she has a prominent right [place] joint with tenderness  Recommend sternoclavicular joint x-ray  Continue gabapentin as ordered I will call the report on the [place] joint to her she will follow-up as needed continue current medications

## 2022-01-17 ENCOUNTER — Telehealth: Payer: Self-pay | Admitting: Orthopedic Surgery

## 2022-01-17 NOTE — Telephone Encounter (Signed)
Patient called to relay she spoke with employer regarding intermittent FMLA, and said that they will be faxing [place] a form. States she would like her note to be done as it was for last year. She is aware of forms process.

## 2022-01-21 DIAGNOSIS — Z0289 Encounter for other administrative examinations: Secondary | ICD-10-CM

## 2022-01-28 ENCOUNTER — Telehealth: Payer: Self-pay | Admitting: Orthopedic Surgery

## 2022-01-28 NOTE — Telephone Encounter (Signed)
Patient left message inquiring about status of her intermittent [employer] forms.

## 2022-01-28 NOTE — Telephone Encounter (Signed)
Forms completed and sending in today. Patient aware.

## 2022-01-31 ENCOUNTER — Ambulatory Visit (HOSPITAL_COMMUNITY): Payer: BC Managed Care – PPO

## 2022-02-04 ENCOUNTER — Ambulatory Visit (HOSPITAL_COMMUNITY)
Admission: RE | Admit: 2022-02-04 | Discharge: 2022-02-04 | Disposition: A | Discharge status: Home / Self Care | Payer: BC Managed Care – PPO | Source: Ambulatory Visit | Attending: Nurse Practitioner | Admitting: Nurse Practitioner

## 2022-02-04 ENCOUNTER — Encounter (HOSPITAL_COMMUNITY): Payer: Self-pay

## 2022-02-04 DIAGNOSIS — Z1231 Encounter for screening mammogram for malignant neoplasm of breast: Secondary | ICD-10-CM | POA: Insufficient documentation

## 2022-04-15 ENCOUNTER — Encounter: Payer: BC Managed Care – PPO | Admitting: Orthopedic Surgery

## 2022-06-06 ENCOUNTER — Encounter: Payer: Self-pay | Admitting: Radiology

## 2022-07-19 ENCOUNTER — Telehealth: Payer: Self-pay | Admitting: Orthopedic Surgery

## 2022-07-19 NOTE — Telephone Encounter (Signed)
Spoke w/the patient, she is needing her forms renewed.  Explained that she'll need to provide [place] with the forms, $25 cash or check for the form fee and she will need to sign a release form.  She has been in touch w/[employer] and they are sending her forms, which she said would take a couple of weeks to get.  She stated that [employer] stated that typically doctors will do the forms for a year and hers were from January 2024 - July 15, 2022.  She is wanting to know about that.  [phone]

## 2022-07-22 ENCOUNTER — Telehealth: Payer: Self-pay | Admitting: Orthopedic Surgery

## 2022-07-22 NOTE — Telephone Encounter (Signed)
Patient last seen 12/17/2021 by Dr. Romeo Apple. [employer] disability forms have been received. Please advise if patient needs followup appt. Or advise work status and provide note. Thanks!

## 2022-11-29 ENCOUNTER — Other Ambulatory Visit: Payer: Self-pay | Admitting: Orthopedic Surgery

## 2022-11-29 DIAGNOSIS — M5410 Radiculopathy, site unspecified: Secondary | ICD-10-CM

## 2022-11-29 DIAGNOSIS — M545 Low back pain, unspecified: Secondary | ICD-10-CM

## 2022-12-13 ENCOUNTER — Encounter: Payer: Self-pay | Admitting: Orthopedic Surgery

## 2022-12-13 ENCOUNTER — Other Ambulatory Visit (INDEPENDENT_AMBULATORY_CARE_PROVIDER_SITE_OTHER): Payer: BC Managed Care – PPO

## 2022-12-13 ENCOUNTER — Ambulatory Visit: Payer: BC Managed Care – PPO | Admitting: Orthopedic Surgery

## 2022-12-13 VITALS — BP 138/83 | HR 101 | Ht 61.0 in | Wt 172.0 lb

## 2022-12-13 DIAGNOSIS — M546 Pain in thoracic spine: Secondary | ICD-10-CM

## 2022-12-13 DIAGNOSIS — M940 Chondrocostal junction syndrome [Tietze]: Secondary | ICD-10-CM | POA: Diagnosis not present

## 2022-12-13 DIAGNOSIS — M545 Low back pain, unspecified: Secondary | ICD-10-CM

## 2022-12-13 DIAGNOSIS — S29011A Strain of muscle and tendon of front wall of thorax, initial encounter: Secondary | ICD-10-CM | POA: Diagnosis not present

## 2022-12-13 MED ORDER — TIZANIDINE HCL 4 MG PO TABS: 4.0000 mg | ORAL_TABLET | Freq: Four times a day (QID) | ORAL | 0 refills | Status: AC | PRN

## 2022-12-13 NOTE — Progress Notes (Signed)
Established patient new problem  57 year old female with a history of lower back pain and radicular leg pain controlled with gabapentin presents with new onset pain in the thoracic spine and sternal area and rib cage after picking up a heavy box and putting it above her head.  She did well for a day or so after some heat and ibuprofen but then she pulled a pallet and the pain came back  She went to urgent care she was diagnosed with costochondritis for the sternal pain.  However, she also has rib pain which indicates intercostal muscle strain and then some upper thoracic back pain.  We will x-ray that to evaluate for possible fracture but this seems like a strain as well.  Examination  BP 138/83   Pulse (!) 101   Ht 5\' 1"  (1.549 m)   Wt 172 lb (78 kg)   BMI 32.50 kg/m   She is awake and alert she is oriented x 3  Body habitus mesomorphic  Neurovascular exam is intact  She is tender in the sternal region on the right and left side  Under the breast area she is tender on both sides as well in the rib cage  She has some chronic lower back tenderness and some mid thoracic back pain and tenderness  X-rays were done radiology  Thoracic spine x-rays  Normal alignment on the AP slight kyphosis on the lateral there are some degenerative changes noted on the T7-T10 area.  The lung markings overlie the bony detail of the ribs no obvious fracture  Impression slight increased kyphosis thoracic disc degenerative disease    Encounter Diagnoses  Name Primary?   Acute midline low back pain without sciatica Yes   Costochondritis, acute    Intercostal muscle strain, initial encounter    Acute midline thoracic back pain    Continue ibuprofen Baclofen was causing her some issues so switched to tizanidine Continue pain patches topical Epsom salt baths Continue gabapentin  Self-limiting follow-up as needed  Meds ordered this encounter  Medications   tiZANidine (ZANAFLEX) 4 MG tablet     Sig: Take 1 tablet (4 mg total) by mouth every 6 (six) hours as needed for muscle spasms.    Dispense:  30 tablet    Refill:  0

## 2022-12-25 ENCOUNTER — Telehealth: Payer: Self-pay | Admitting: Orthopedic Surgery

## 2022-12-25 NOTE — Telephone Encounter (Signed)
[employer] forms received. To [employer].

## 2022-12-26 ENCOUNTER — Encounter: Payer: Self-pay | Admitting: Internal Medicine

## 2022-12-26 ENCOUNTER — Telehealth: Payer: Self-pay | Admitting: *Deleted

## 2022-12-26 ENCOUNTER — Encounter: Payer: Self-pay | Admitting: *Deleted

## 2022-12-26 ENCOUNTER — Other Ambulatory Visit: Payer: Self-pay | Admitting: *Deleted

## 2022-12-26 ENCOUNTER — Ambulatory Visit (INDEPENDENT_AMBULATORY_CARE_PROVIDER_SITE_OTHER): Payer: BC Managed Care – PPO | Admitting: Internal Medicine

## 2022-12-26 VITALS — BP 113/79 | HR 111 | Temp 98.6°F | Ht 61.0 in | Wt 173.4 lb

## 2022-12-26 DIAGNOSIS — K5904 Chronic idiopathic constipation: Secondary | ICD-10-CM | POA: Diagnosis not present

## 2022-12-26 DIAGNOSIS — R1013 Epigastric pain: Secondary | ICD-10-CM | POA: Diagnosis not present

## 2022-12-26 DIAGNOSIS — K219 Gastro-esophageal reflux disease without esophagitis: Secondary | ICD-10-CM | POA: Diagnosis not present

## 2022-12-26 NOTE — Telephone Encounter (Signed)
Pt informed that [place] is scheduled for 12/30/22, arrive at 11:15 am to check in. NPO 6-8 hours prior.

## 2022-12-26 NOTE — Patient Instructions (Signed)
I am going to order an ultrasound of your gallbladder and liver today to further evaluate your abdominal pain.  Recommend taking famotidine 40 mg daily.  For your constipation, I want you to start taking over the counter MiraLAX 1 capful daily.  If this does not adequately control your constipation, I would increase to 2 capfuls daily.  If this is still not adequate, then I would add on once daily Dulcolax (bisacodyl) tablet.   Be sure to drink at least 4 to 6 glasses of water daily.   Follow up in 2 to 3 months.  If symptoms or not improved, we will consider upper endoscopy to further evaluate.  It was very nice seeing you again today.  Dr. Marletta Lor

## 2022-12-26 NOTE — Progress Notes (Signed)
Referring Provider: No ref. provider found Primary Care Physician:  System, Provider Not In Primary GI:  Dr. Marletta Lor  Chief Complaint  Patient presents with   Follow-up    Pt following up on acid reflux, pain upper abd area    HPI:   Summar Ann York is a 57 y.o. female who presents to clinic today for follow-up visit.  Last seen 10/19/2020.  Chronic GERD: Taking famotidine 40 mg daily. States she was previously trialed on pantoprazole which made her sick. She was switched to Nexium which only helped some.   Today, states she is having epigastric pain that radiates to her RUQ and LUQ.  Worse after meals.  Does note she stopped taking her famotidine for months prior to onset.  Was diagnosed with costochondritis.  Given ibuprofen which made her symptoms worse.  Also notes issues with constipation and straining.  MiraLAX has been helping but she has been taking this intermittently as she read online she was only supposed to take it for 7 days.  No melena hematochezia.  Colonoscopy 11/10/2020 with multiple tubular adenomas removed.  5-year recall.  Past Medical History:  Diagnosis Date   Anxiety    Arthritis    CAD (coronary artery disease)    Chest pain    GERD (gastroesophageal reflux disease)    Hyperlipidemia    Hypothyroidism    Panic attacks    PONV (postoperative nausea and vomiting)    Thyroid disease     Past Surgical History:  Procedure Laterality Date   BREAST SURGERY     biopsy left breast   CESAREAN SECTION     x 2   COLONOSCOPY WITH PROPOFOL N/A 11/10/2020   Procedure: COLONOSCOPY WITH PROPOFOL;  Surgeon: Lanelle Bal, DO;  Location: [facility];  Service: Endoscopy;  Laterality: N/A;  8:45am   ENDOMETRIAL ABLATION     HYSTEROSCOPY  10/31/2020   Procedure: DIAGNOSTIC HYSTEROSCOPY;  Surgeon: Myna Hidalgo, DO;  Location: [facility];  Service: Gynecology;;   POLYPECTOMY  11/10/2020   Procedure: POLYPECTOMY;  Surgeon: Lanelle Bal, DO;  Location: [facility];   Service: Endoscopy;;   TUBAL LIGATION      Current Outpatient Medications  Medication Sig Dispense Refill   acetaminophen (TYLENOL) 500 MG tablet Take 1,000 mg by mouth every 6 (six) hours as needed for mild pain or moderate pain.     ALPRAZolam (XANAX) 0.5 MG tablet Take 0.5 mg by mouth at bedtime as needed for anxiety.     cholecalciferol (VITAMIN D3) 25 MCG (1000 UNIT) tablet Take 2,000 Units by mouth daily.     famotidine (PEPCID) 40 MG tablet Take 40 mg by mouth daily.     gabapentin (NEURONTIN) 100 MG capsule TAKE 1 CAPSULE BY MOUTH IN THE MORNING THEN 2 CAPSULES AT BEDTIME 60 capsule 0   thyroid (ARMOUR) 30 MG tablet Take 45 mg by mouth daily before breakfast. One 30 mg and 1 15mg  qd     tiZANidine (ZANAFLEX) 4 MG tablet Take 1 tablet (4 mg total) by mouth every 6 (six) hours as needed for muscle spasms. 30 tablet 0   polyethylene glycol-electrolytes (NULYTELY) 420 g solution As directed (Patient not taking: Reported on 12/26/2022) 4000 mL 0   No current facility-administered medications for this visit.    Allergies as of 12/26/2022 - Review Complete 12/26/2022  Allergen Reaction Noted   Septra [sulfamethoxazole-trimethoprim] Anaphylaxis 01/22/2017   Sulfa antibiotics Anaphylaxis 01/22/2020    Family History  Problem  Relation Age of Onset   Rheum arthritis Mother    Hypertension Mother    Thyroid disease Mother    Stroke Mother    Hypertension Father    Heart disease Father    Hypertension Sister    Thyroid disease Sister    Hypertension Brother     Social History   Socioeconomic History   Marital status: Divorced    Spouse name: Not on file   Number of children: Not on file   Years of education: Not on file   Highest education level: Not on file  Occupational History   Not on file  Tobacco Use   Smoking status: Former    Current packs/day: 0.00    Average packs/day: 0.3 packs/day for 1 year (0.3 ttl pk-yrs)    Types: Cigarettes    Start date: 10/19/1991     Quit date: 10/18/1992    Years since quitting: 30.2   Smokeless tobacco: Never  Vaping Use   Vaping status: Never Used  Substance and Sexual Activity   Alcohol use: No   Drug use: No   Sexual activity: Yes    Birth control/protection: Surgical    Comment: tubal and ablation  Other Topics Concern   Not on file  Social History Narrative   Not on file   Social Determinants of Health   Financial Resource Strain: Low Risk  (07/11/2020)   Overall Financial Resource Strain (CARDIA)    Difficulty of Paying Living Expenses: Not hard at all  Food Insecurity: No Food Insecurity (07/11/2020)   Hunger Vital Sign    Worried About Running Out of Food in the Last Year: Never true    Ran Out of Food in the Last Year: Never true  Transportation Needs: No Transportation Needs (07/11/2020)   PRAPARE - Administrator, Civil Service (Medical): No    Lack of Transportation (Non-Medical): No  Physical Activity: Insufficiently Active (07/11/2020)   Exercise Vital Sign    Days of Exercise per Week: 2 days    Minutes of Exercise per Session: 10 min  Stress: Stress Concern Present (07/11/2020)   [employer] of Occupational Health - Occupational Stress Questionnaire    Feeling of Stress : Very much  Social Connections: Moderately Integrated (07/11/2020)   Social Connection and Isolation Panel [NHANES]    Frequency of Communication with Friends and Family: More than three times a week    Frequency of Social Gatherings with Friends and Family: Twice a week    Attends Religious Services: More than 4 times per year    Active Member of [employer] or Organizations: Yes    Attends Banker Meetings: Never    Marital Status: Divorced    Subjective: Review of Systems  Constitutional:  Negative for chills and fever.  HENT:  Negative for congestion and hearing loss.   Eyes:  Negative for blurred vision and double vision.  Respiratory:  Negative for cough and shortness of breath.    Cardiovascular:  Negative for chest pain and palpitations.  Gastrointestinal:  Positive for abdominal pain and heartburn. Negative for blood in stool, constipation, diarrhea, melena and vomiting.  Genitourinary:  Negative for dysuria and urgency.  Musculoskeletal:  Negative for joint pain and myalgias.  Skin:  Negative for itching and rash.  Neurological:  Negative for dizziness and headaches.  Psychiatric/Behavioral:  Negative for depression. The patient is not nervous/anxious.      Objective: BP 113/79   Pulse (!) 111   Temp 98.6  F (37 C)   Ht 5\' 1"  (1.549 m)   Wt 173 lb 6.4 oz (78.7 kg)   BMI 32.76 kg/m  Physical Exam Constitutional:      Appearance: Normal appearance.  HENT:     Head: Normocephalic and atraumatic.  Eyes:     Extraocular Movements: Extraocular movements intact.     Conjunctiva/sclera: Conjunctivae normal.  Cardiovascular:     Rate and Rhythm: Normal rate and regular rhythm.  Pulmonary:     Effort: Pulmonary effort is normal.     Breath sounds: Normal breath sounds.  Abdominal:     General: Bowel sounds are normal.     Palpations: Abdomen is soft.  Musculoskeletal:        General: No swelling. Normal range of motion.     Cervical back: Normal range of motion and neck supple.  Skin:    General: Skin is warm and dry.     Coloration: Skin is not jaundiced.  Neurological:     General: No focal deficit present.     Mental Status: She is alert and oriented to person, place, and time.  Psychiatric:        Mood and Affect: Mood normal.        Behavior: Behavior normal.      Assessment/Plan:  1.  Epigastric pain-new onset, worsened with NSAIDs.  Possible gastritis, recommend she restart her famotidine 40 mg daily.  Previously trialed and failed pantoprazole and Nexium.  I will order right upper quadrant ultrasound to further evaluate for biliary colic.  Call with results.  If symptoms not improved, consider upper endoscopy to further evaluate.  2.   Chronic GERD-previously well-controlled on famotidine, resume as above.  3.  Chronic idiopathic constipation-may be contributing to her abdominal pain as well.  Recommend MiraLAX 1-2 capfuls daily and see how she does.  Ensure that she is staying well-hydrated.  Follow-up in 2 to 3 months. 12/26/2022 10:37 AM   Disclaimer: This note was dictated with voice recognition software. Similar sounding words can inadvertently be transcribed and may not be corrected upon review.

## 2022-12-30 ENCOUNTER — Ambulatory Visit (HOSPITAL_COMMUNITY)
Admission: RE | Admit: 2022-12-30 | Discharge: 2022-12-30 | Disposition: A | Discharge status: Home / Self Care | Payer: BC Managed Care – PPO | Source: Ambulatory Visit | Attending: Internal Medicine | Admitting: Internal Medicine

## 2022-12-30 DIAGNOSIS — R1013 Epigastric pain: Secondary | ICD-10-CM | POA: Insufficient documentation

## 2023-01-01 ENCOUNTER — Other Ambulatory Visit (HOSPITAL_COMMUNITY): Payer: Self-pay | Admitting: Nurse Practitioner

## 2023-01-01 DIAGNOSIS — Z1231 Encounter for screening mammogram for malignant neoplasm of breast: Secondary | ICD-10-CM

## 2023-02-10 ENCOUNTER — Ambulatory Visit (HOSPITAL_COMMUNITY)
Admission: RE | Admit: 2023-02-10 | Discharge: 2023-02-10 | Disposition: A | Discharge status: Home / Self Care | Payer: BC Managed Care – PPO | Source: Ambulatory Visit | Attending: Nurse Practitioner | Admitting: Nurse Practitioner

## 2023-02-10 ENCOUNTER — Encounter (HOSPITAL_COMMUNITY): Payer: Self-pay

## 2023-02-10 DIAGNOSIS — Z1231 Encounter for screening mammogram for malignant neoplasm of breast: Secondary | ICD-10-CM | POA: Diagnosis present

## 2023-02-23 ENCOUNTER — Other Ambulatory Visit: Payer: Self-pay | Admitting: Orthopedic Surgery

## 2023-02-23 DIAGNOSIS — M5410 Radiculopathy, site unspecified: Secondary | ICD-10-CM

## 2023-02-23 DIAGNOSIS — M545 Low back pain, unspecified: Secondary | ICD-10-CM

## 2023-03-05 ENCOUNTER — Encounter: Payer: Self-pay | Admitting: Internal Medicine

## 2023-03-30 DIAGNOSIS — E785 Hyperlipidemia, unspecified: Secondary | ICD-10-CM | POA: Insufficient documentation

## 2023-05-07 ENCOUNTER — Ambulatory Visit: Payer: BC Managed Care – PPO | Admitting: Internal Medicine

## 2023-05-15 ENCOUNTER — Ambulatory Visit: Payer: BC Managed Care – PPO | Admitting: Internal Medicine

## 2023-05-19 ENCOUNTER — Other Ambulatory Visit: Payer: Self-pay | Admitting: Orthopedic Surgery

## 2023-05-19 DIAGNOSIS — M5410 Radiculopathy, site unspecified: Secondary | ICD-10-CM

## 2023-05-19 DIAGNOSIS — M545 Low back pain, unspecified: Secondary | ICD-10-CM

## 2023-07-02 ENCOUNTER — Telehealth: Payer: Self-pay | Admitting: Orthopedic Surgery

## 2023-07-02 NOTE — Telephone Encounter (Signed)
 Patient has appt. 4/3. Last seen 12/2022. She is requesting intermittent leave , 4 episodes x month ,lasting 8 hrs. 2 days month allow to leave early. Please advise. [phone]

## 2023-07-03 ENCOUNTER — Ambulatory Visit (INDEPENDENT_AMBULATORY_CARE_PROVIDER_SITE_OTHER): Payer: BC Managed Care – PPO | Admitting: Audiology

## 2023-07-03 ENCOUNTER — Institutional Professional Consult (permissible substitution) (INDEPENDENT_AMBULATORY_CARE_PROVIDER_SITE_OTHER): Payer: BC Managed Care – PPO

## 2023-07-03 NOTE — Telephone Encounter (Signed)
 IC patient, advised form completed and faxed.

## 2023-07-10 ENCOUNTER — Ambulatory Visit: Admitting: Orthopedic Surgery

## 2023-07-10 DIAGNOSIS — M722 Plantar fascial fibromatosis: Secondary | ICD-10-CM | POA: Diagnosis not present

## 2023-07-10 DIAGNOSIS — M545 Low back pain, unspecified: Secondary | ICD-10-CM

## 2023-07-10 DIAGNOSIS — M5416 Radiculopathy, lumbar region: Secondary | ICD-10-CM | POA: Diagnosis not present

## 2023-07-10 DIAGNOSIS — M5410 Radiculopathy, site unspecified: Secondary | ICD-10-CM

## 2023-07-10 MED ORDER — METHYLPREDNISOLONE ACETATE 40 MG/ML IJ SUSP
40.0000 mg | Freq: Once | INTRAMUSCULAR | Status: AC
Start: 2023-07-10 — End: 2023-07-10
  Administered 2023-07-10: 40 mg via INTRA_ARTICULAR

## 2023-07-10 MED ORDER — GABAPENTIN 100 MG PO CAPS
ORAL_CAPSULE | ORAL | 5 refills | Status: AC
Start: 2023-07-10 — End: ?

## 2023-07-10 NOTE — Progress Notes (Signed)
 New problem   Chief Complaint  Patient presents with   Foot Problem    Left heel pain         Foot Pain This is a new problem. The current episode started more than 1 month ago. The problem occurs constantly. The problem has been waxing and waning. Pertinent negatives include no abdominal pain, anorexia, arthralgias, change in bowel habit, chest pain, chills, congestion, coughing, diaphoresis, fever, joint swelling, numbness or weakness. The symptoms are aggravated by walking and standing. She has tried acetaminophen and rest (ankle range of motion ; tuli heel cup) for the symptoms. The treatment provided mild relief.     Ann York is here today to evaluate her left heel pain with what sounds like plantar fasciitis she is  Having a trouble with her FMLA form as it was filled out differently from the original 1 and is causing her some difficulty with her employer.  She would like her gabapentin refilled for the radiculopathy of the left lower extremity  Meds ordered this encounter  Medications   gabapentin (NEURONTIN) 100 MG capsule    Sig: TAKE 1 CAPSULE BY MOUTH IN THE MORNING AND 2 CAPSULES AT BEDTIME    Dispense:  60 capsule    Refill:  5    Procedure note Inject plantar fascia    Timeout was completed to confirm the site of injection left heel   The medications used were 40 mg of Depo-Medrol and 1% lidocaine 3 cc  Anesthesia was provided by ethyl chloride and the skin was prepped with alcohol.  After cleaning the skin with alcohol a 25-gauge needle was used to inject the plantar fascia, no complications were noted sterile bandage was applied   Add exercises   Add cryotherapy   (Today we addressed acute pain left heel including injection but we also addressed ongoing problems with the left leg and medication refill)

## 2023-07-14 ENCOUNTER — Telehealth: Payer: Self-pay | Admitting: Radiology

## 2023-07-14 NOTE — Telephone Encounter (Signed)
 I filled out and faxed form sent for scan

## 2023-07-14 NOTE — Telephone Encounter (Signed)
-----   Message from [facility] May sent at 07/11/2023 11:10 AM EDT ----- Form was already done, and sent in but it was incorrect per Dr Rexene Edison.  Once stamped I think ok to send in.  I assume it has a fax. ----- Message ----- From: Caffie Damme, RT Sent: 07/11/2023   8:36 AM EDT To: Cherre Huger, RT  I filled it out, I can stamp mid day when I go back downstairs, and will fax but patient has not signed, what is process? I'm a little confused about the process after the form is completed. ----- Message ----- From: May, Wendy, RT Sent: 07/11/2023   8:15 AM EDT To: Caffie Damme, RT  Can you please print a copy of the recent blank one and fill out per last form- per Dr Rexene Edison below? Thanks ----- Message ----- From: Vickki Hearing, MD Sent: 07/10/2023   4:26 PM EDT To: Cherre Huger, RT  Her FMLA was filled out wrong   Pull the first one and copy that

## 2023-10-02 ENCOUNTER — Ambulatory Visit: Admitting: Adult Health

## 2023-10-02 ENCOUNTER — Other Ambulatory Visit (HOSPITAL_COMMUNITY)
Admission: RE | Admit: 2023-10-02 | Discharge: 2023-10-02 | Disposition: A | Discharge status: Home / Self Care | Source: Ambulatory Visit | Attending: Adult Health | Admitting: Adult Health

## 2023-10-02 ENCOUNTER — Encounter: Payer: Self-pay | Admitting: Adult Health

## 2023-10-02 VITALS — BP 120/83 | HR 94 | Ht 61.0 in | Wt 176.0 lb

## 2023-10-02 DIAGNOSIS — Z1331 Encounter for screening for depression: Secondary | ICD-10-CM | POA: Diagnosis not present

## 2023-10-02 DIAGNOSIS — Z1211 Encounter for screening for malignant neoplasm of colon: Secondary | ICD-10-CM

## 2023-10-02 DIAGNOSIS — K649 Unspecified hemorrhoids: Secondary | ICD-10-CM

## 2023-10-02 DIAGNOSIS — Z01419 Encounter for gynecological examination (general) (routine) without abnormal findings: Secondary | ICD-10-CM | POA: Insufficient documentation

## 2023-10-02 LAB — HEMOCCULT GUIAC POC 1CARD (OFFICE): Fecal Occult Blood, POC: NEGATIVE

## 2023-10-02 NOTE — Progress Notes (Signed)
 Patient ID: Ann York, female   DOB: [birth year], 58 y.o.   MRN: [id number] History of Present Illness: Ann York is a 58 year old white female, divorced, PM in for a well woman gyn exam and pap. She works at [employer] and [employer] and her church.   PCP is GORMAN Duke, NP at [facility] in [place]    Current Medications, Allergies, Past Medical History, Past Surgical History, Family History and Social History were reviewed in [employer] record.     Review of Systems: Patient denies any headaches, hearing loss, fatigue, blurred vision, shortness of breath, chest pain, abdominal pain, problems with bowel movements,(BM hard at times, may bleed, has known hemorrhoids and uses miralax) urination, or intercourse(not active in 2 years). No joint pain or mood swings.  Sometimes after peeing may feel air from vagina Denies any vaginal bleeding   Physical Exam:BP 120/83 (BP Location: Right Arm, Patient Position: Sitting, Cuff Size: Normal)   Pulse 94   Ht 5' 1 (1.549 m)   Wt 176 lb (79.8 kg)   BMI 33.25 kg/m   General:  Well developed, well nourished, no acute distress Skin:  Warm and dry Neck:  Midline trachea, normal thyroid , good ROM, no lymphadenopathy Lungs; Clear to auscultation bilaterally Breast:  No dominant palpable mass, retraction, or nipple discharge Cardiovascular: Regular rate and rhythm Abdomen:  Soft, non tender, no hepatosplenomegaly Pelvic:  External genitalia is normal in appearance, no lesions.  The vagina is pale. Urethra has no lesions or masses. The cervix is smooth, pap with HR HPV genotyping performed.  Uterus is felt to be normal size, shape, and contour.  No adnexal masses or tenderness noted.Bladder is non tender, no masses felt. Rectal: Good sphincter tone, no polyps, + hemorrhoids felt.  Hemoccult negative. Extremities/musculoskeletal:  No swelling or varicosities noted, no clubbing or cyanosis Psych:  No mood changes, alert and cooperative,seems  happy AA is 0 Fall risk is low    10/02/2023    1:40 PM 07/11/2020    2:33 PM 11/13/2017    8:51 AM  Depression screen PHQ 2/9  Decreased Interest 1 0 0  Down, Depressed, Hopeless 0 0 0  PHQ - 2 Score 1 0 0  Altered sleeping 1 2   Tired, decreased energy 1 2   Change in appetite 0 0   Feeling bad or failure about yourself  0 0   Trouble concentrating 0 0   Moving slowly or fidgety/restless 0 0   Suicidal thoughts 0 0   PHQ-9 Score 3 4          10/02/2023    1:40 PM 07/11/2020    2:33 PM  GAD 7 : Generalized Anxiety Score  Nervous, Anxious, on Edge 1 3  Control/stop worrying 1 1  Worry too much - different things 1 1  Trouble relaxing 1 2  Restless 0 0  Easily annoyed or irritable 0 2  Afraid - awful might happen 0 0  Total GAD 7 Score 4 9      Upstream - 10/02/23 1336       Pregnancy Intention Screening   Does the patient want to become pregnant in the next year? N/A    Does the patient's partner want to become pregnant in the next year? N/A    Would the patient like to discuss contraceptive options today? N/A      Contraception Wrap Up   Current Method Female Sterilization   ablation   End Method  Female Sterilization   ablation   Contraception Counseling Provided No          Examination chaperoned by Clarita Salt LPN  Impression and plan: 1. Encounter for gynecological examination with Papanicolaou smear of cervix (Primary) Pap sent Pap in 3 years if negative Labs with PCP Physical in 1 year Mammogram was negative 02/10/23 Colonoscopy per GI  - Cytology - PAP( [place])  2. Encounter for screening fecal occult blood testing Hemoccult was negative  - POCT occult blood stool  3. Hemorrhoids, unspecified hemorrhoid type Use preparation H, tucks, take miralax

## 2023-10-07 ENCOUNTER — Ambulatory Visit: Payer: Self-pay | Admitting: Adult Health

## 2023-10-07 LAB — CYTOLOGY - PAP
Comment: NEGATIVE
Diagnosis: NEGATIVE
High risk HPV: NEGATIVE

## 2023-11-26 ENCOUNTER — Telehealth: Payer: Self-pay | Admitting: Orthopedic Surgery

## 2023-11-26 DIAGNOSIS — M5410 Radiculopathy, site unspecified: Secondary | ICD-10-CM

## 2023-11-26 DIAGNOSIS — M545 Low back pain, unspecified: Secondary | ICD-10-CM

## 2023-11-27 ENCOUNTER — Telehealth: Payer: Self-pay | Admitting: Orthopedic Surgery

## 2023-11-27 MED ORDER — MELOXICAM 7.5 MG PO TABS: 7.5000 mg | ORAL_TABLET | Freq: Every day | ORAL | 2 refills | Status: AC

## 2023-11-27 NOTE — Telephone Encounter (Signed)
 Dr. Areatha pt - pt lvm requesting a refill for Meloxicam  7.5mg , 30 tablets, daily to be sent to [facility]

## 2024-02-09 ENCOUNTER — Encounter: Payer: Self-pay | Admitting: Radiology

## 2024-02-12 DIAGNOSIS — H9311 Tinnitus, right ear: Secondary | ICD-10-CM | POA: Insufficient documentation

## 2024-02-12 DIAGNOSIS — M26609 Unspecified temporomandibular joint disorder, unspecified side: Secondary | ICD-10-CM | POA: Insufficient documentation

## 2024-02-25 ENCOUNTER — Other Ambulatory Visit (HOSPITAL_COMMUNITY): Payer: Self-pay | Admitting: Nurse Practitioner

## 2024-02-25 DIAGNOSIS — Z1231 Encounter for screening mammogram for malignant neoplasm of breast: Secondary | ICD-10-CM

## 2024-03-08 ENCOUNTER — Encounter (HOSPITAL_COMMUNITY): Payer: Self-pay

## 2024-03-08 ENCOUNTER — Ambulatory Visit (HOSPITAL_COMMUNITY)
Admission: RE | Admit: 2024-03-08 | Discharge: 2024-03-08 | Disposition: A | Source: Ambulatory Visit | Attending: Nurse Practitioner | Admitting: Nurse Practitioner

## 2024-03-08 DIAGNOSIS — Z1231 Encounter for screening mammogram for malignant neoplasm of breast: Secondary | ICD-10-CM | POA: Diagnosis present

## 2024-04-28 ENCOUNTER — Telehealth: Payer: Self-pay | Admitting: Orthopedic Surgery

## 2024-04-28 NOTE — Telephone Encounter (Signed)
 Dr. Areatha pt - this pt was scheduled for Friday, but will cb to rs due to insurance change.  She is wanting to come in for her neck, she stated that Dr. Margrette did see her for her neck, took x-rays and they discussed, not a new problem.  She is hearing scratching/clicking in there, when she has her ins, does she need to rs for here or go somewhere else?  [phone]

## 2024-04-30 ENCOUNTER — Ambulatory Visit: Admitting: Orthopedic Surgery
